# Patient Record
Sex: Female | Born: 1980 | Race: White | Hispanic: No | Marital: Married | Smoking: Current every day smoker
Health system: Southern US, Community
[De-identification: ages and names within clinical notes are randomized; demographics above are authoritative.]

## PROBLEM LIST (undated history)

## (undated) DIAGNOSIS — I10 Essential (primary) hypertension: Secondary | ICD-10-CM

## (undated) HISTORY — PX: TUBAL LIGATION: SHX77

## (undated) HISTORY — PX: RENAL ARTERY STENT: SHX2321

## (undated) HISTORY — PX: HERNIA REPAIR: SHX51

---

## 2011-12-31 ENCOUNTER — Emergency Department (HOSPITAL_BASED_OUTPATIENT_CLINIC_OR_DEPARTMENT_OTHER)
Admission: EM | Admit: 2011-12-31 | Discharge: 2011-12-31 | Disposition: A | Discharge status: Home / Self Care | Payer: Self-pay | Attending: Emergency Medicine | Admitting: Emergency Medicine

## 2011-12-31 ENCOUNTER — Emergency Department (INDEPENDENT_AMBULATORY_CARE_PROVIDER_SITE_OTHER): Payer: Self-pay

## 2011-12-31 ENCOUNTER — Encounter (HOSPITAL_BASED_OUTPATIENT_CLINIC_OR_DEPARTMENT_OTHER): Payer: Self-pay | Admitting: *Deleted

## 2011-12-31 DIAGNOSIS — R000 Tachycardia, unspecified: Secondary | ICD-10-CM

## 2011-12-31 DIAGNOSIS — Y92009 Unspecified place in unspecified non-institutional (private) residence as the place of occurrence of the external cause: Secondary | ICD-10-CM | POA: Insufficient documentation

## 2011-12-31 DIAGNOSIS — I10 Essential (primary) hypertension: Secondary | ICD-10-CM | POA: Insufficient documentation

## 2011-12-31 DIAGNOSIS — S8000XA Contusion of unspecified knee, initial encounter: Secondary | ICD-10-CM | POA: Insufficient documentation

## 2011-12-31 DIAGNOSIS — M25569 Pain in unspecified knee: Secondary | ICD-10-CM | POA: Insufficient documentation

## 2011-12-31 DIAGNOSIS — W010XXA Fall on same level from slipping, tripping and stumbling without subsequent striking against object, initial encounter: Secondary | ICD-10-CM | POA: Insufficient documentation

## 2011-12-31 DIAGNOSIS — F17200 Nicotine dependence, unspecified, uncomplicated: Secondary | ICD-10-CM | POA: Insufficient documentation

## 2011-12-31 DIAGNOSIS — S99919A Unspecified injury of unspecified ankle, initial encounter: Secondary | ICD-10-CM

## 2011-12-31 DIAGNOSIS — W172XXA Fall into hole, initial encounter: Secondary | ICD-10-CM

## 2011-12-31 DIAGNOSIS — S8001XA Contusion of right knee, initial encounter: Secondary | ICD-10-CM

## 2011-12-31 DIAGNOSIS — R05 Cough: Secondary | ICD-10-CM

## 2011-12-31 HISTORY — DX: Essential (primary) hypertension: I10

## 2011-12-31 MED ORDER — HYDROCODONE-ACETAMINOPHEN 5-325 MG PO TABS: 1.0000 | ORAL_TABLET | ORAL | Status: AC | PRN

## 2011-12-31 NOTE — ED Provider Notes (Signed)
History     CSN: [id number]  Arrival date & time 12/31/11  1611   First MD Initiated Contact with Patient 12/31/11 1748      Chief Complaint  Patient presents with  . Knee Pain    (Consider location/radiation/quality/duration/timing/severity/associated sxs/prior treatment) HPI History provided by pt.   Pt was playing with her children outside yesterday, stepped in a hole and fell forwards, landing on her knees and outstretched hands.  Did not hit her head.  C/o pain in her right knee only that is aggravated by bearing weight but especially by flexion.  Radiates 2/3 of the way down her shin.  No associated weakness/paresthesias.  Has not taken anything for pain.    Past Medical History  Diagnosis Date  . Hypertension     Past Surgical History  Procedure Date  . Tubal ligation   . Hernia repair     No family history on file.  History  Substance Use Topics  . Smoking status: Current Everyday Smoker  . Smokeless tobacco: Never Used  . Alcohol Use: No    OB History    Grav Para Term Preterm Abortions TAB SAB Ect Mult Living                  Review of Systems  All other systems reviewed and are negative.    Allergies  Motrin  Home Medications   Current Outpatient Rx  Name Route Sig Dispense Refill  . ACETAMINOPHEN 325 MG PO TABS Oral Take by mouth every 6 (six) hours as needed. Patient used this medication for her knee pain.    Marland Kitchen LISINOPRIL 10 MG PO TABS Oral Take 10 mg by mouth daily.    Marland Kitchen HYDROCODONE-ACETAMINOPHEN 5-325 MG PO TABS Oral Take 1 tablet by mouth every 4 (four) hours as needed for pain. 12 tablet 0    BP 172/108  Pulse 135  Temp(Src) 98.3 F (36.8 C) (Oral)  Resp 20  Ht 5\' 5"  (1.651 m)  Wt 270 lb (122.471 kg)  BMI 44.93 kg/m2  SpO2 99%  LMP 12/25/2011  Physical Exam  Nursing note and vitals reviewed. Constitutional: She is oriented to person, place, and time. She appears well-developed and well-nourished. No distress.  HENT:  Head:  Normocephalic and atraumatic.  Eyes:       Normal appearance  Neck: Normal range of motion.  Musculoskeletal:       Right knee w/out deformity or skin changes.  Mild edema compared to left.  Tenderness medial aspect of anterior knee as well as medial joint line and proximal tibia.  Pain w/ minimal passive flexion of knee.  No laxity w/ varus/valgus stress. Nml ankle exam. 2+ DP pulse and distal sensation intact.    Neurological: She is alert and oriented to person, place, and time.  Psychiatric: She has a normal mood and affect. Her behavior is normal.    ED Course  Procedures (including critical care time)   Date: 12/31/2011  Rate: 127  Rhythm: sinus tachycardia  QRS Axis: left  Intervals: normal  ST/T Wave abnormalities: normal  Conduction Disutrbances:none  Narrative Interpretation:   Old EKG Reviewed: none available   Labs Reviewed - No data to display Dg Knee Complete 4 Views Right  12/31/2011  *RADIOLOGY REPORT*  Clinical Data: Larey Seat in a hole 1 day ago injuring right knee, right anterior knee pain  RIGHT KNEE - COMPLETE 4+ VIEW  Comparison: None  Findings: Clothing artifacts distal thigh. Joint spaces preserved. No acute fracture,  dislocation or bone destruction. No knee joint effusion.  IMPRESSION: No acute bony abnormalities.  Original Report Authenticated By: Lollie Marrow, M.D.     1. Contusion of right knee       MDM  Healthy 31yo F presents w/ right knee injury secondary to mechanical fall yesterday.  Exam sig for mild edema, medial tenderness and pain w/ passive flexion.  Xray neg for acute fx/dislocation.  Results discussed w/ pt.  Nursing staff provided her w/ knee sleeve for comfort and d/c'd home w/ vicodin for pain.  Recommended ice, elevation and avoidance of activities that aggravate pain as well.  Referred to ortho for persistent pain or development of joint laxity.  6:26 PM   At time of discharge, HR is 138.  EKG shows sinus tach.  Pt is afebrile.  She has  had URI sx including cough for the past week but CXR neg for pneumonia.  She reports that she had an energy drink right before coming to ED and has had multiple mountain dews today.  Dr. Ethelda Chick has seen her and feels comfortable w/ her discharge.  I recommended that patient drink plenty of water and try to cut back on caffeine.  Also recommended that she f/u with her PCP within the next week for BP recheck.  Recently had lisinopril dose cut from 20mg  to 10mg /d.         Otilio Miu, PA 12/31/11 1910  Arie Sabina [place], [place] 12/31/11 Ernestina [place]

## 2011-12-31 NOTE — ED Notes (Signed)
Pt reports she fell in a hole- fell yesterday- c/o pain in right knee- ambulatory

## 2011-12-31 NOTE — ED Provider Notes (Signed)
Complains of right knee pain since falling the whole yesterday also admits to mild cough.. denies shortness of breath denies chest pain denies lightheadedness. Patient noted to be tachycardic. She reports taking "an energy pill", "an energy drink"  And drinking several mountain dews today in order to stay awake.  Tachycardia felt to be secondary to caffeine and stimulants; she is advised to stop drinking energy drinks, nergy pills andMountain Dew Sinus tachycardia felt secondary to stimulants  Doug Sou, MD 12/31/11 1919

## 2011-12-31 NOTE — ED Notes (Signed)
Pt HR 138 and BP elevated. PA notified and further tests initiated.

## 2011-12-31 NOTE — ED Notes (Signed)
Pt acuity changed from 4 to 3 based on pt vitals and plan of care.

## 2011-12-31 NOTE — ED Notes (Signed)
Pt stated she fell in a hole in her yard yesterday afternoon and twisted her right knee. Pt treated pain with tylenol and ice. Pt denies hitting her head.

## 2011-12-31 NOTE — Discharge Instructions (Signed)
Take tylenol as needed for mild-moderate pain.  Take vicodin as prescribed for severe pain.   Do not drive within four hours of taking this medication (may cause drowsiness or confusion).   Ice 2-3 times a day for 15-20 minutes, elevate when possible and avoid activities that aggravate pain.   Follow up with the orthopedic doctor if pain has not started to improve in 5-7 days or you develop weakness of joint.

## 2012-01-01 NOTE — ED Provider Notes (Signed)
Medical screening examination/treatment/procedure(s) were conducted as a shared visit with non-physician practitioner(s) and myself.  I personally evaluated the patient during the encounter  Doug Sou, MD 01/01/12 [phone]

## 2012-01-29 ENCOUNTER — Emergency Department (INDEPENDENT_AMBULATORY_CARE_PROVIDER_SITE_OTHER): Payer: Self-pay

## 2012-01-29 ENCOUNTER — Emergency Department (HOSPITAL_BASED_OUTPATIENT_CLINIC_OR_DEPARTMENT_OTHER)
Admission: EM | Admit: 2012-01-29 | Discharge: 2012-01-29 | Disposition: A | Discharge status: Home / Self Care | Payer: Self-pay | Attending: Emergency Medicine | Admitting: Emergency Medicine

## 2012-01-29 ENCOUNTER — Encounter (HOSPITAL_BASED_OUTPATIENT_CLINIC_OR_DEPARTMENT_OTHER): Payer: Self-pay

## 2012-01-29 DIAGNOSIS — IMO0002 Reserved for concepts with insufficient information to code with codable children: Secondary | ICD-10-CM | POA: Insufficient documentation

## 2012-01-29 DIAGNOSIS — X503XXA Overexertion from repetitive movements, initial encounter: Secondary | ICD-10-CM | POA: Insufficient documentation

## 2012-01-29 DIAGNOSIS — S46919A Strain of unspecified muscle, fascia and tendon at shoulder and upper arm level, unspecified arm, initial encounter: Secondary | ICD-10-CM

## 2012-01-29 DIAGNOSIS — I10 Essential (primary) hypertension: Secondary | ICD-10-CM | POA: Insufficient documentation

## 2012-01-29 DIAGNOSIS — M25519 Pain in unspecified shoulder: Secondary | ICD-10-CM

## 2012-01-29 DIAGNOSIS — X500XXA Overexertion from strenuous movement or load, initial encounter: Secondary | ICD-10-CM

## 2012-01-29 MED ORDER — TRAMADOL HCL 50 MG PO TABS: 50.0000 mg | ORAL_TABLET | Freq: Four times a day (QID) | ORAL | Status: DC | PRN

## 2012-01-29 MED ORDER — TRAMADOL HCL 50 MG PO TABS: 50.0000 mg | ORAL_TABLET | Freq: Four times a day (QID) | ORAL | Status: AC | PRN

## 2012-01-29 NOTE — ED Notes (Signed)
Pt advised of need to keep record of BP and HR-states her PCP has been adjusting her BP meds and she has appt in approx 1-2 weeks for f/u

## 2012-01-29 NOTE — ED Provider Notes (Signed)
History     CSN: [id number]  Arrival date & time 01/29/12  1956   First MD Initiated Contact with Patient 01/29/12 2109      Chief Complaint  Patient presents with  . Shoulder Injury    (Consider location/radiation/quality/duration/timing/severity/associated sxs/prior treatment) HPI Comments: Pt states that she was moving furniture today and now has left shoulder pain with movement  Patient is a 31 y.o. female presenting with shoulder injury. The history is provided by the patient. No language interpreter was used.  Shoulder Injury This is a new problem. The current episode started today. The problem occurs constantly. The problem has been unchanged. The symptoms are aggravated by bending. She has tried acetaminophen for the symptoms.    Past Medical History  Diagnosis Date  . Hypertension     Past Surgical History  Procedure Date  . Tubal ligation   . Hernia repair     History reviewed. No pertinent family history.  History  Substance Use Topics  . Smoking status: Current Everyday Smoker  . Smokeless tobacco: Never Used  . Alcohol Use: No    OB History    Grav Para Term Preterm Abortions TAB SAB Ect Mult Living                  Review of Systems  Constitutional: Negative.   Respiratory: Negative.   Cardiovascular: Negative.   Neurological: Negative.     Allergies  Motrin  Home Medications   Current Outpatient Rx  Name Route Sig Dispense Refill  . ACETAMINOPHEN 325 MG PO TABS Oral Take by mouth every 6 (six) hours as needed. Patient used this medication for her knee pain.    Marland Kitchen LISINOPRIL 10 MG PO TABS Oral Take 10 mg by mouth daily.    . TRAMADOL HCL 50 MG PO TABS Oral Take 1 tablet (50 mg total) by mouth every 6 (six) hours as needed for pain. 15 tablet 0    BP 179/109  Pulse 135  Temp(Src) 98.5 F (36.9 C) (Oral)  Resp 20  Ht 5\' 5"  (1.651 m)  Wt 270 lb (122.471 kg)  BMI 44.93 kg/m2  SpO2 99%  LMP 01/25/2012  Physical Exam  Nursing note  and vitals reviewed. Constitutional: She is oriented to person, place, and time. She appears well-developed and well-nourished.  Cardiovascular: Normal rate and regular rhythm.   Pulmonary/Chest: Effort normal and breath sounds normal.  Musculoskeletal:       Pt has generalized tenderness to the left shoulder:pt has full rom:no deformity noted:pulses intact:neurologically intact  Neurological: She is alert and oriented to person, place, and time.  Skin: Skin is dry.  Psychiatric: She has a normal mood and affect.    ED Course  Procedures (including critical care time)  Labs Reviewed - No data to display Dg Shoulder Left  01/29/2012  *RADIOLOGY REPORT*  Clinical Data: Pain after lifting injury.  LEFT SHOULDER - 2+ VIEW  Comparison: Chest film 12/31/2011  Findings: No acute fracture or dislocation.  Visualized portion of the left hemithorax is normal.  IMPRESSION: Normal left shoulder.  Original Report Authenticated By: Consuello Bossier, M.D.     1. Shoulder strain       MDM  No acute bony abnormality noted:will treat with tramadol for pain:pt educated on htn        Teressa Lower, NP 01/29/12 2128  Teressa Lower, NP 01/29/12 2130

## 2012-01-29 NOTE — Discharge Instructions (Signed)
Joint Sprain A sprain is a tear or stretch in the ligaments that hold a joint together. Severe sprains may need as long as 3-6 weeks of immobilization and/or exercises to heal completely. Sprained joints should be rested and protected. If not, they can become unstable and prone to re-injury. Proper treatment can reduce your pain, shorten the period of disability, and reduce the risk of repeated injuries. TREATMENT   Rest and elevate the injured joint to reduce pain and swelling.   Apply ice packs to the injury for 20-30 minutes every 2-3 hours for the next 2-3 days.   Keep the injury wrapped in a compression bandage or splint as long as the joint is painful or as instructed by your caregiver.   Do not use the injured joint until it is completely healed to prevent re-injury and chronic instability. Follow the instructions of your caregiver.   Long-term sprain management may require exercises and/or treatment by a physical therapist. Taping or special braces may help stabilize the joint until it is completely better.  SEEK MEDICAL CARE IF:   You develop increased pain or swelling of the joint.   You develop increasing redness and warmth of the joint.   You develop a fever.   It becomes stiff.   Your hand or foot gets cold or numb.  Document Released: 11/05/2004 Document Revised: 09/17/2011 Document Reviewed: 10/15/2008 [employer] Patient Information 2012 [employer]. 

## 2012-01-29 NOTE — ED Notes (Addendum)
Pt states that she has injured her leftshoulder today while moving.  Pt states that she has taken tylenol with no relief.  No obvious deformity.

## 2012-01-30 NOTE — ED Provider Notes (Signed)
Medical screening examination/treatment/procedure(s) were performed by non-physician practitioner and as supervising physician I was immediately available for consultation/collaboration.   Carleene Cooper III, MD 01/30/12 [phone]

## 2013-01-14 ENCOUNTER — Emergency Department (HOSPITAL_BASED_OUTPATIENT_CLINIC_OR_DEPARTMENT_OTHER): Payer: 59

## 2013-01-14 ENCOUNTER — Encounter (HOSPITAL_BASED_OUTPATIENT_CLINIC_OR_DEPARTMENT_OTHER): Payer: Self-pay

## 2013-01-14 ENCOUNTER — Emergency Department (HOSPITAL_BASED_OUTPATIENT_CLINIC_OR_DEPARTMENT_OTHER)
Admission: EM | Admit: 2013-01-14 | Discharge: 2013-01-14 | Disposition: A | Discharge status: Home / Self Care | Payer: 59 | Attending: Emergency Medicine | Admitting: Emergency Medicine

## 2013-01-14 DIAGNOSIS — IMO0002 Reserved for concepts with insufficient information to code with codable children: Secondary | ICD-10-CM | POA: Insufficient documentation

## 2013-01-14 DIAGNOSIS — W010XXA Fall on same level from slipping, tripping and stumbling without subsequent striking against object, initial encounter: Secondary | ICD-10-CM | POA: Insufficient documentation

## 2013-01-14 DIAGNOSIS — S8391XA Sprain of unspecified site of right knee, initial encounter: Secondary | ICD-10-CM

## 2013-01-14 DIAGNOSIS — Y92009 Unspecified place in unspecified non-institutional (private) residence as the place of occurrence of the external cause: Secondary | ICD-10-CM | POA: Insufficient documentation

## 2013-01-14 DIAGNOSIS — Z79899 Other long term (current) drug therapy: Secondary | ICD-10-CM | POA: Insufficient documentation

## 2013-01-14 DIAGNOSIS — Y9383 Activity, rough housing and horseplay: Secondary | ICD-10-CM | POA: Insufficient documentation

## 2013-01-14 DIAGNOSIS — F17200 Nicotine dependence, unspecified, uncomplicated: Secondary | ICD-10-CM | POA: Insufficient documentation

## 2013-01-14 DIAGNOSIS — I10 Essential (primary) hypertension: Secondary | ICD-10-CM | POA: Insufficient documentation

## 2013-01-14 MED ORDER — OXYCODONE-ACETAMINOPHEN 5-325 MG PO TABS: 1.0000 | ORAL_TABLET | Freq: Four times a day (QID) | ORAL | Status: DC | PRN

## 2013-01-14 MED ORDER — OXYCODONE-ACETAMINOPHEN 5-325 MG PO TABS
2.0000 | ORAL_TABLET | Freq: Once | ORAL | Status: AC
  Administered 2013-01-14: 2 via ORAL
  Filled 2013-01-14 (×2): qty 2

## 2013-01-14 NOTE — ED Notes (Signed)
MD at bedside. 

## 2013-01-14 NOTE — ED Notes (Signed)
Pt states that she was playing outside with her children and fell into a hole injuring her R knee.  Pt states that with flexion of the knee she feels a pulling sensation like a rubber band.

## 2013-01-14 NOTE — ED Provider Notes (Signed)
History    This chart was scribed for Charles B. Bernette Mayers, MD scribed by Magnus Sinning. The patient was seen in room MH10/MH10 at 15:52  CSN: [id number]  Arrival date & time 01/14/13  1427   First MD Initiated Contact with Patient 01/14/13 1540   Chief Complaint  Patient presents with  . Knee Injury    (Consider location/radiation/quality/duration/timing/severity/associated sxs/prior treatment) The history is provided by the patient. No language interpreter was used.   Samantha Griffin is a 32 y.o. female who presents to the Emergency Department complaining of one day of gradually worsened moderate right knee pain with associated right calf pain, as a result of an injury after falling into a hole in her yard when playing with children yesterday.The patient states pain is aggravated with bending and notes she has been unable to get comfortable since. Past Medical History  Diagnosis Date  . Hypertension     Past Surgical History  Procedure Laterality Date  . Tubal ligation    . Hernia repair    . Renal artery stent      History reviewed. No pertinent family history.  History  Substance Use Topics  . Smoking status: Current Every Day Smoker    Types: Cigarettes  . Smokeless tobacco: Never Used  . Alcohol Use: No    Review of Systems  Musculoskeletal:       Right knee pain    Allergies  Motrin and Ultram  Home Medications   Current Outpatient Rx  Name  Route  Sig  Dispense  Refill  . acetaminophen (TYLENOL) 325 MG tablet   Oral   Take by mouth every 6 (six) hours as needed. Patient used this medication for her knee pain.         Marland Kitchen lisinopril (PRINIVIL,ZESTRIL) 10 MG tablet   Oral   Take 10 mg by mouth daily.         . naproxen sodium (ANAPROX) 220 MG tablet   Oral   Take 220 mg by mouth 2 (two) times daily with a meal.           BP 161/97  Pulse 128  Temp(Src) 98.1 F (36.7 C) (Oral)  Resp 20  Ht 5\' 6"  (1.676 m)  Wt 275 lb (124.739 kg)  BMI 44.41  kg/m2  SpO2 95%  LMP 12/30/2012  Physical Exam  Nursing note and vitals reviewed. Constitutional: She is oriented to person, place, and time. She appears well-developed and well-nourished.  HENT:  Head: Normocephalic and atraumatic.  Eyes: Conjunctivae are normal. Right eye exhibits no discharge. Left eye exhibits no discharge.  Neck: Neck supple.  Pulmonary/Chest: Effort normal.  Musculoskeletal:  Tenderness to the right knee with mild instability with anterior drawer. Neurovascularly intact.  Neurological: She is alert and oriented to person, place, and time. No cranial nerve deficit.  Skin: Skin is warm and dry. No rash noted.  Psychiatric: She has a normal mood and affect. Her behavior is normal.    ED Course  Procedures (including critical care time) DIAGNOSTIC STUDIES: Oxygen Saturation is 95% on room air, adequate by my interpretation.    COORDINATION OF CARE: 15:53: Physical exam performed. Labs Reviewed - No data to display Dg Knee Complete 4 Views Right  01/14/2013  *RADIOLOGY REPORT*  Clinical Data: Injured knee.  Anterior pain.  RIGHT KNEE - COMPLETE 4+ VIEW  Comparison: 12/31/2011.  Findings: No fracture or dislocation.  IMPRESSION: No fracture.   Original Report Authenticated By: Lacy Duverney, M.D.  1. Knee sprain and strain, right, initial encounter       MDM  Xray neg for bony injury. Suspect ligamentous injury, will place in knee immobilizer, pain meds, Ortho follow up.   I personally performed the services described in this documentation, which was scribed in my presence. The recorded information has been reviewed and is accurate.          Charles B. Bernette Mayers, MD 01/14/13 1606

## 2013-07-13 ENCOUNTER — Encounter (HOSPITAL_BASED_OUTPATIENT_CLINIC_OR_DEPARTMENT_OTHER): Payer: Self-pay | Admitting: *Deleted

## 2013-07-13 ENCOUNTER — Emergency Department (HOSPITAL_BASED_OUTPATIENT_CLINIC_OR_DEPARTMENT_OTHER)
Admission: EM | Admit: 2013-07-13 | Discharge: 2013-07-13 | Disposition: A | Discharge status: Home / Self Care | Payer: 59 | Attending: Emergency Medicine | Admitting: Emergency Medicine

## 2013-07-13 ENCOUNTER — Emergency Department (HOSPITAL_BASED_OUTPATIENT_CLINIC_OR_DEPARTMENT_OTHER): Payer: 59

## 2013-07-13 DIAGNOSIS — Y9389 Activity, other specified: Secondary | ICD-10-CM | POA: Insufficient documentation

## 2013-07-13 DIAGNOSIS — W108XXA Fall (on) (from) other stairs and steps, initial encounter: Secondary | ICD-10-CM | POA: Insufficient documentation

## 2013-07-13 DIAGNOSIS — F17200 Nicotine dependence, unspecified, uncomplicated: Secondary | ICD-10-CM | POA: Insufficient documentation

## 2013-07-13 DIAGNOSIS — S96911A Strain of unspecified muscle and tendon at ankle and foot level, right foot, initial encounter: Secondary | ICD-10-CM

## 2013-07-13 DIAGNOSIS — Y929 Unspecified place or not applicable: Secondary | ICD-10-CM | POA: Insufficient documentation

## 2013-07-13 DIAGNOSIS — I10 Essential (primary) hypertension: Secondary | ICD-10-CM | POA: Insufficient documentation

## 2013-07-13 DIAGNOSIS — S93609A Unspecified sprain of unspecified foot, initial encounter: Secondary | ICD-10-CM | POA: Insufficient documentation

## 2013-07-13 DIAGNOSIS — Z791 Long term (current) use of non-steroidal anti-inflammatories (NSAID): Secondary | ICD-10-CM | POA: Insufficient documentation

## 2013-07-13 MED ORDER — HYDROCODONE-ACETAMINOPHEN 5-325 MG PO TABS: 2.0000 | ORAL_TABLET | ORAL | Status: DC | PRN

## 2013-07-13 NOTE — ED Notes (Signed)
Pt c/o right ankle injury fall down stairs x 8 hrs ago

## 2013-07-13 NOTE — ED Provider Notes (Signed)
CSN: [id number]     Arrival date & time 07/13/13  1934 History   First MD Initiated Contact with Patient 07/13/13 1944     Chief Complaint  Patient presents with  . Ankle Pain   (Consider location/radiation/quality/duration/timing/severity/associated sxs/prior Treatment) HPI Comments: Pt states that she fell on her stairs 8 hours ago and his now having pain and swelling to there right foot:denies loc:pt states that she is able to walk on the foot but she is in a lot of pain:denies numbness  The history is provided by the patient. No language interpreter was used.    Past Medical History  Diagnosis Date  . Hypertension    Past Surgical History  Procedure Laterality Date  . Tubal ligation    . Hernia repair    . Renal artery stent     History reviewed. No pertinent family history. History  Substance Use Topics  . Smoking status: Current Every Day Smoker    Types: Cigarettes  . Smokeless tobacco: Never Used  . Alcohol Use: No   OB History   Grav Para Term Preterm Abortions TAB SAB Ect Mult Living                 Review of Systems  Constitutional: Negative.   Respiratory: Negative.   Cardiovascular: Negative.     Allergies  Motrin and Ultram  Home Medications   Current Outpatient Rx  Name  Route  Sig  Dispense  Refill  . acetaminophen (TYLENOL) 325 MG tablet   Oral   Take by mouth every 6 (six) hours as needed. Patient used this medication for her knee pain.         . naproxen sodium (ANAPROX) 220 MG tablet   Oral   Take 220 mg by mouth 2 (two) times daily with a meal.         . oxyCODONE-acetaminophen (PERCOCET/ROXICET) 5-325 MG per tablet   Oral   Take 1-2 tablets by mouth every 6 (six) hours as needed for pain.   20 tablet   0    BP 147/98  Pulse 103  Temp(Src) 99.7 F (37.6 C) (Oral)  Resp 16  Ht 5\' 5"  (1.651 m)  Wt 260 lb (117.935 kg)  BMI 43.27 kg/m2  SpO2 98%  LMP 07/04/2013 Physical Exam  Nursing note and vitals  reviewed. Constitutional: She is oriented to person, place, and time. She appears well-developed and well-nourished.  Cardiovascular: Normal rate and regular rhythm.   Pulmonary/Chest: Effort normal and breath sounds normal.  Musculoskeletal: Normal range of motion.  Swelling to the top of the right foot in the lateral aspect:pt has full HYQ:MVHQIO intact  Neurological: She is alert and oriented to person, place, and time.  Skin: Skin is warm and dry.    ED Course  Procedures (including critical care time) Labs Review Labs Reviewed - No data to display Imaging Review Dg Ankle Complete Right  07/13/2013   CLINICAL DATA:  Fall.  Ankle injury. Lateral ankle pain.  EXAM: RIGHT ANKLE - COMPLETE 3+ VIEW  COMPARISON:  None.  FINDINGS: There is no evidence of fracture, dislocation, or joint effusion. There is no evidence of arthropathy or other focal bone abnormality. Soft tissues are unremarkable.  IMPRESSION: Negative.   Electronically Signed   By: Myles Rosenthal M.D.   On: 07/13/2013 20:26   Dg Foot Complete Right  07/13/2013   CLINICAL DATA:  Fall. Foot injury and pain.  EXAM: RIGHT FOOT COMPLETE - 3+ VIEW  COMPARISON:  None.  FINDINGS: There is no evidence of fracture or dislocation. There is no evidence of arthropathy or other focal bone abnormality. Soft tissues are unremarkable. Small plantar calcaneal spur incidentally noted.  IMPRESSION: No acute findings.   Electronically Signed   By: Myles Rosenthal M.D.   On: 07/13/2013 20:27    MDM   1. Right foot strain, initial encounter    Pt wrapped for comfort:no acute bony abnormality noted:pt wrapped for comfort:given vicodin and follow up with DR. Vivi Barrack, NP 07/13/13 2045

## 2013-07-13 NOTE — ED Provider Notes (Signed)
  Medical screening examination/treatment/procedure(s) were performed by non-physician practitioner and as supervising physician I was immediately available for consultation/collaboration.   Gerhard Munch, MD 07/13/13 2318

## 2013-12-03 ENCOUNTER — Encounter (HOSPITAL_BASED_OUTPATIENT_CLINIC_OR_DEPARTMENT_OTHER): Payer: Self-pay | Admitting: Emergency Medicine

## 2013-12-03 ENCOUNTER — Emergency Department (HOSPITAL_BASED_OUTPATIENT_CLINIC_OR_DEPARTMENT_OTHER)
Admission: EM | Admit: 2013-12-03 | Discharge: 2013-12-03 | Disposition: A | Discharge status: Home / Self Care | Payer: Medicaid Other | Attending: Emergency Medicine | Admitting: Emergency Medicine

## 2013-12-03 DIAGNOSIS — E669 Obesity, unspecified: Secondary | ICD-10-CM | POA: Insufficient documentation

## 2013-12-03 DIAGNOSIS — Y929 Unspecified place or not applicable: Secondary | ICD-10-CM | POA: Insufficient documentation

## 2013-12-03 DIAGNOSIS — X500XXA Overexertion from strenuous movement or load, initial encounter: Secondary | ICD-10-CM | POA: Insufficient documentation

## 2013-12-03 DIAGNOSIS — S39012A Strain of muscle, fascia and tendon of lower back, initial encounter: Secondary | ICD-10-CM

## 2013-12-03 DIAGNOSIS — F17200 Nicotine dependence, unspecified, uncomplicated: Secondary | ICD-10-CM | POA: Insufficient documentation

## 2013-12-03 DIAGNOSIS — S339XXA Sprain of unspecified parts of lumbar spine and pelvis, initial encounter: Secondary | ICD-10-CM | POA: Insufficient documentation

## 2013-12-03 DIAGNOSIS — I10 Essential (primary) hypertension: Secondary | ICD-10-CM | POA: Insufficient documentation

## 2013-12-03 DIAGNOSIS — Z79899 Other long term (current) drug therapy: Secondary | ICD-10-CM | POA: Insufficient documentation

## 2013-12-03 DIAGNOSIS — Y9389 Activity, other specified: Secondary | ICD-10-CM | POA: Insufficient documentation

## 2013-12-03 DIAGNOSIS — S335XXA Sprain of ligaments of lumbar spine, initial encounter: Principal | ICD-10-CM

## 2013-12-03 MED ORDER — NAPROXEN 500 MG PO TABS: 500.0000 mg | ORAL_TABLET | Freq: Two times a day (BID) | ORAL | Status: DC

## 2013-12-03 MED ORDER — HYDROCODONE-ACETAMINOPHEN 5-325 MG PO TABS
1.0000 | ORAL_TABLET | Freq: Once | ORAL | Status: AC
  Administered 2013-12-03: 1 via ORAL
  Filled 2013-12-03: qty 1

## 2013-12-03 MED ORDER — HYDROCODONE-ACETAMINOPHEN 5-325 MG PO TABS: 1.0000 | ORAL_TABLET | Freq: Four times a day (QID) | ORAL | Status: DC | PRN

## 2013-12-03 MED ORDER — KETOROLAC TROMETHAMINE 30 MG/ML IJ SOLN: 30.0000 mg | Freq: Once | INTRAMUSCULAR | Status: DC

## 2013-12-03 MED ORDER — CYCLOBENZAPRINE HCL 10 MG PO TABS
5.0000 mg | ORAL_TABLET | Freq: Once | ORAL | Status: AC
  Administered 2013-12-03: 5 mg via ORAL
  Filled 2013-12-03: qty 1

## 2013-12-03 MED ORDER — NAPROXEN 250 MG PO TABS
500.0000 mg | ORAL_TABLET | Freq: Two times a day (BID) | ORAL | Status: DC
  Administered 2013-12-03: 500 mg via ORAL
  Filled 2013-12-03: qty 2

## 2013-12-03 NOTE — ED Notes (Signed)
Patient here with lower right sided back pain x 2 days. Pain started while moving sofa on Friday and twisted when it slipped out of her hand, no relief with otc meds

## 2013-12-03 NOTE — ED Provider Notes (Signed)
CSN: [id number]     Arrival date & time 12/03/13  1254 History   First MD Initiated Contact with Patient 12/03/13 1305     Chief Complaint  Patient presents with  . Back Pain     (Consider location/radiation/quality/duration/timing/severity/associated sxs/prior Treatment) HPI  This a 33 year old female who presents with back pain. Patient reports that she was lifting a sofa on Friday when she twisted her back and felt spasming of the right side of her back. She reports 4/10 pain on the right side of her back. She states "it feels like a knot." Patient has tried Tylenol home without relief. She's allergic to ibuprofen. Patient denies any urinary symptoms, weakness, numbness, tingling, history of drug abuse, recent steroid use. She has been ambulatory.  Past Medical History  Diagnosis Date  . Hypertension    Past Surgical History  Procedure Laterality Date  . Tubal ligation    . Hernia repair    . Renal artery stent     No family history on file. History  Substance Use Topics  . Smoking status: Current Every Day Smoker    Types: Cigarettes  . Smokeless tobacco: Never Used  . Alcohol Use: No   OB History   Grav Para Term Preterm Abortions TAB SAB Ect Mult Living                 Review of Systems  Constitutional: Negative for fever.  Genitourinary: Negative for dysuria.       Denies urinary retention  Musculoskeletal: Positive for back pain.  Skin: Negative for wound.  Neurological: Negative for weakness and numbness.  All other systems reviewed and are negative.      Allergies  Motrin and Ultram  Home Medications   Current Outpatient Rx  Name  Route  Sig  Dispense  Refill  . lisinopril (PRINIVIL,ZESTRIL) 10 MG tablet   Oral   Take 10 mg by mouth daily.         Marland Kitchen. HYDROcodone-acetaminophen (NORCO/VICODIN) 5-325 MG per tablet   Oral   Take 1 tablet by mouth every 6 (six) hours as needed.   6 tablet   0   . naproxen (NAPROSYN) 500 MG tablet   Oral  Take 1 tablet (500 mg total) by mouth 2 (two) times daily.   30 tablet   0    BP 188/124  Pulse 114  Temp(Src) 97.4 F (36.3 C) (Oral)  Resp 20  Ht 5\' 7"  (1.702 m)  Wt 260 lb (117.935 kg)  BMI 40.71 kg/m2  SpO2 99% Physical Exam  Nursing note and vitals reviewed. Constitutional: She is oriented to person, place, and time. No distress.  obese  HENT:  Head: Normocephalic and atraumatic.  Cardiovascular: Normal rate and regular rhythm.   Pulmonary/Chest: Effort normal and breath sounds normal. No respiratory distress.  Abdominal: Soft. There is no tenderness.  Musculoskeletal:  Tenderness to palpation and spasm noted over the right lumbar paraspinous muscle region, no midline tenderness step-off or deformity,   Neurological: She is alert and oriented to person, place, and time.  Skin: Skin is warm and dry.  Psychiatric: She has a normal mood and affect.    ED Course  Procedures (including critical care time) Labs Review Labs Reviewed - No data to display Imaging Review No results found.  EKG Interpretation   None       MDM   Final diagnoses:  Lumbosacral strain    Patient presents with back pain following an injury on Friday.  She is nontoxic-appearing. She has no red flag for back pain. She has paraspinous muscle tenderness and spasm. Patient was given naproxen, Norco, and Flexeril the knee. At this time have no indication for plain films and low suspicion for fracture. Patient will be discharged home with a short course of pain medication and will be encouraged to continue naproxen and back exercises.  After history, exam, and medical workup I feel the patient has been appropriately medically screened and is safe for discharge home. Pertinent diagnoses were discussed with the patient. Patient was given return precautions.     Shon Baton, MD 12/03/13 [phone]

## 2013-12-03 NOTE — ED Notes (Signed)
MD at bedside. 

## 2013-12-03 NOTE — Discharge Instructions (Signed)
Back Pain, Adult Low back pain is very common. About 1 in 5 people have back pain.The cause of low back pain is rarely dangerous. The pain often gets better over time.About half of people with a sudden onset of back pain feel better in just 2 weeks. About 8 in 10 people feel better by 6 weeks.  CAUSES Some common causes of back pain include:  Strain of the muscles or ligaments supporting the spine.  Wear and tear (degeneration) of the spinal discs.  Arthritis.  Direct injury to the back. DIAGNOSIS Most of the time, the direct cause of low back pain is not known.However, back pain can be treated effectively even when the exact cause of the pain is unknown.Answering your caregiver's questions about your overall health and symptoms is one of the most accurate ways to make sure the cause of your pain is not dangerous. If your caregiver needs more information, he or she may order lab work or imaging tests (X-rays or MRIs).However, even if imaging tests show changes in your back, this usually does not require surgery. HOME CARE INSTRUCTIONS For many people, back pain returns.Since low back pain is rarely dangerous, it is often a condition that people can learn to manageon their own.   Remain active. It is stressful on the back to sit or stand in one place. Do not sit, drive, or stand in one place for more than 30 minutes at a time. Take short walks on level surfaces as soon as pain allows.Try to increase the length of time you walk each day.  Do not stay in bed.Resting more than 1 or 2 days can delay your recovery.  Do not avoid exercise or work.Your body is made to move.It is not dangerous to be active, even though your back may hurt.Your back will likely heal faster if you return to being active before your pain is gone.  Pay attention to your body when you bend and lift. Many people have less discomfortwhen lifting if they bend their knees, keep the load close to their bodies,and  avoid twisting. Often, the most comfortable positions are those that put less stress on your recovering back.  Find a comfortable position to sleep. Use a firm mattress and lie on your side with your knees slightly bent. If you lie on your back, put a pillow under your knees.  Only take over-the-counter or prescription medicines as directed by your caregiver. Over-the-counter medicines to reduce pain and inflammation are often the most helpful.Your caregiver may prescribe muscle relaxant drugs.These medicines help dull your pain so you can more quickly return to your normal activities and healthy exercise.  Put ice on the injured area.  Put ice in a plastic bag.  Place a towel between your skin and the bag.  Leave the ice on for 15-20 minutes, 03-04 times a day for the first 2 to 3 days. After that, ice and heat may be alternated to reduce pain and spasms.  Ask your caregiver about trying back exercises and gentle massage. This may be of some benefit.  Avoid feeling anxious or stressed.Stress increases muscle tension and can worsen back pain.It is important to recognize when you are anxious or stressed and learn ways to manage it.Exercise is a great option. SEEK MEDICAL CARE IF:  You have pain that is not relieved with rest or medicine.  You have pain that does not improve in 1 week.  You have new symptoms.  You are generally not feeling well. SEEK   IMMEDIATE MEDICAL CARE IF:   You have pain that radiates from your back into your legs.  You develop new bowel or bladder control problems.  You have unusual weakness or numbness in your arms or legs.  You develop nausea or vomiting.  You develop abdominal pain.  You feel faint. Document Released: 09/28/2005 Document Revised: 03/29/2012 Document Reviewed: 02/16/2011 [employer] Patient Information 2014 [employer].  

## 2014-01-21 ENCOUNTER — Emergency Department (HOSPITAL_BASED_OUTPATIENT_CLINIC_OR_DEPARTMENT_OTHER): Payer: Medicaid Other

## 2014-01-21 ENCOUNTER — Encounter (HOSPITAL_BASED_OUTPATIENT_CLINIC_OR_DEPARTMENT_OTHER): Payer: Self-pay | Admitting: Emergency Medicine

## 2014-01-21 ENCOUNTER — Emergency Department (HOSPITAL_BASED_OUTPATIENT_CLINIC_OR_DEPARTMENT_OTHER)
Admission: EM | Admit: 2014-01-21 | Discharge: 2014-01-21 | Disposition: A | Discharge status: Home / Self Care | Payer: Medicaid Other | Attending: Emergency Medicine | Admitting: Emergency Medicine

## 2014-01-21 DIAGNOSIS — Y929 Unspecified place or not applicable: Secondary | ICD-10-CM | POA: Insufficient documentation

## 2014-01-21 DIAGNOSIS — Y9389 Activity, other specified: Secondary | ICD-10-CM | POA: Insufficient documentation

## 2014-01-21 DIAGNOSIS — I10 Essential (primary) hypertension: Secondary | ICD-10-CM | POA: Insufficient documentation

## 2014-01-21 DIAGNOSIS — F17200 Nicotine dependence, unspecified, uncomplicated: Secondary | ICD-10-CM | POA: Insufficient documentation

## 2014-01-21 DIAGNOSIS — S9000XA Contusion of unspecified ankle, initial encounter: Secondary | ICD-10-CM | POA: Insufficient documentation

## 2014-01-21 DIAGNOSIS — X500XXA Overexertion from strenuous movement or load, initial encounter: Secondary | ICD-10-CM | POA: Insufficient documentation

## 2014-01-21 DIAGNOSIS — S93409A Sprain of unspecified ligament of unspecified ankle, initial encounter: Secondary | ICD-10-CM | POA: Insufficient documentation

## 2014-01-21 DIAGNOSIS — R296 Repeated falls: Secondary | ICD-10-CM | POA: Insufficient documentation

## 2014-01-21 DIAGNOSIS — Z79899 Other long term (current) drug therapy: Secondary | ICD-10-CM | POA: Insufficient documentation

## 2014-01-21 MED ORDER — OXYCODONE-ACETAMINOPHEN 5-325 MG PO TABS
1.0000 | ORAL_TABLET | Freq: Once | ORAL | Status: AC
  Administered 2014-01-21: 1 via ORAL
  Filled 2014-01-21: qty 1

## 2014-01-21 NOTE — Discharge Instructions (Signed)
Return to the ED with any concerns including increased pain, swelling/discoloration/numbness of foot or toes, or any other alarming symptoms °

## 2014-01-21 NOTE — ED Provider Notes (Signed)
CSN: [id number]     Arrival date & time 01/21/14  1814 History  This chart was scribed for Samantha Chick, MD by Beverly Milch, ED Scribe. This patient was seen in room MH06/MH06 and the patient's care was started at 6:57 PM.    Chief Complaint  Patient presents with  . Ankle Injury      Patient is a 33 y.o. female presenting with lower extremity injury. The history is provided by the patient. No language interpreter was used.  Ankle Injury This is a new problem. The current episode started 3 to 5 hours ago. The problem occurs constantly. The problem has been gradually worsening. Pertinent negatives include no chest pain, no abdominal pain, no headaches and no shortness of breath. The symptoms are aggravated by walking, exertion, stress and twisting. The symptoms are relieved by ice and acetaminophen. She has tried acetaminophen for the symptoms. The treatment provided mild relief.  Pt reports she was playing with her children and fell twisting her right ankle.   Past Medical History  Diagnosis Date  . Hypertension    Past Surgical History  Procedure Laterality Date  . Tubal ligation    . Hernia repair    . Renal artery stent     No family history on file. History  Substance Use Topics  . Smoking status: Current Every Day Smoker    Types: Cigarettes  . Smokeless tobacco: Never Used  . Alcohol Use: No   OB History   Grav Para Term Preterm Abortions TAB SAB Ect Mult Living                 Review of Systems  Respiratory: Negative for shortness of breath.   Cardiovascular: Negative for chest pain.  Gastrointestinal: Negative for abdominal pain.  Musculoskeletal: Positive for arthralgias (right ankle).  Neurological: Negative for headaches.  All other systems reviewed and are negative.     Allergies  Motrin and Ultram  Home Medications   Current Outpatient Rx  Name  Route  Sig  Dispense  Refill  . lisinopril-hydrochlorothiazide (PRINZIDE,ZESTORETIC) 20-12.5 MG  per tablet   Oral   Take 1 tablet by mouth daily.          Triage Vitals: BP 152/111  Pulse 124  Temp(Src) 97.9 F (36.6 C) (Oral)  Resp 24  Ht 5\' 5"  (1.651 m)  Wt 248 lb (112.492 kg)  BMI 41.27 kg/m2  SpO2 98%  LMP 01/21/2014  Physical Exam  Nursing note and vitals reviewed. Constitutional: She appears well-developed and well-nourished. No distress.  HENT:  Head: Normocephalic and atraumatic.  Right Ear: External ear normal.  Left Ear: External ear normal.  Eyes: Conjunctivae are normal. Right eye exhibits no discharge. Left eye exhibits no discharge. No scleral icterus.  Neck: Neck supple. No tracheal deviation present.  Cardiovascular: Normal rate.   Pulmonary/Chest: Effort normal. No stridor. No respiratory distress.  Musculoskeletal: She exhibits tenderness. She exhibits no edema.  3 cm contusion over right medial malleolus with tenderness to palpation but no tenderness to palpation over foot or knee, 2+ dorsalis pedis pulse, toes brisk capillary refill and sensation intact.  Neurological: She is alert. Cranial nerve deficit: no gross deficits.  Skin: Skin is warm and dry. No rash noted.  Psychiatric: She has a normal mood and affect.    ED Course  Procedures (including critical care time)  DIAGNOSTIC STUDIES: Oxygen Saturation is 98% on RA, normal by my interpretation.    COORDINATION OF CARE: 7:00 PM-  Pt advised of plan for treatment and pt agrees.    Labs Review Labs Reviewed - No data to display Imaging Review No results found.   EKG Interpretation None      MDM   Final diagnoses:  Ankle sprain    Pt presenting with c/o pain in right ankle after fall today while playing with children.  Xray reassuring.  Pt placed in ASO.  Given information for ortho followup.  Discharged with strict return precautions.  Pt agreeable with plan.  I personally performed the services described in this documentation, which was scribed in my presence. The recorded  information has been reviewed and is accurate.    Samantha ChickMartha K Linker, MD 01/24/14 [phone]

## 2014-01-21 NOTE — ED Notes (Signed)
Right ankle injury today.  Pt has pain with weight bearing.

## 2014-03-25 ENCOUNTER — Encounter (HOSPITAL_BASED_OUTPATIENT_CLINIC_OR_DEPARTMENT_OTHER): Payer: Self-pay | Admitting: Emergency Medicine

## 2014-03-25 ENCOUNTER — Emergency Department (HOSPITAL_BASED_OUTPATIENT_CLINIC_OR_DEPARTMENT_OTHER)
Admission: EM | Admit: 2014-03-25 | Discharge: 2014-03-25 | Disposition: A | Discharge status: Home / Self Care | Payer: Medicaid Other | Attending: Emergency Medicine | Admitting: Emergency Medicine

## 2014-03-25 DIAGNOSIS — IMO0002 Reserved for concepts with insufficient information to code with codable children: Secondary | ICD-10-CM | POA: Insufficient documentation

## 2014-03-25 DIAGNOSIS — R0602 Shortness of breath: Secondary | ICD-10-CM | POA: Insufficient documentation

## 2014-03-25 DIAGNOSIS — Y9389 Activity, other specified: Secondary | ICD-10-CM | POA: Insufficient documentation

## 2014-03-25 DIAGNOSIS — F17200 Nicotine dependence, unspecified, uncomplicated: Secondary | ICD-10-CM | POA: Insufficient documentation

## 2014-03-25 DIAGNOSIS — S46911A Strain of unspecified muscle, fascia and tendon at shoulder and upper arm level, right arm, initial encounter: Secondary | ICD-10-CM

## 2014-03-25 DIAGNOSIS — Z79899 Other long term (current) drug therapy: Secondary | ICD-10-CM | POA: Insufficient documentation

## 2014-03-25 DIAGNOSIS — X500XXA Overexertion from strenuous movement or load, initial encounter: Secondary | ICD-10-CM | POA: Insufficient documentation

## 2014-03-25 DIAGNOSIS — Y9289 Other specified places as the place of occurrence of the external cause: Secondary | ICD-10-CM | POA: Insufficient documentation

## 2014-03-25 DIAGNOSIS — I10 Essential (primary) hypertension: Secondary | ICD-10-CM | POA: Insufficient documentation

## 2014-03-25 MED ORDER — HYDROCODONE-ACETAMINOPHEN 5-325 MG PO TABS: 2.0000 | ORAL_TABLET | ORAL | Status: DC | PRN

## 2014-03-25 NOTE — ED Provider Notes (Signed)
CSN: [id number]     Arrival date & time 03/25/14  1502 History   First MD Initiated Contact with Patient 03/25/14 1522     Chief Complaint  Patient presents with  . Shoulder Injury     (Consider location/radiation/quality/duration/timing/severity/associated sxs/prior Treatment) Patient is a 33 y.o. female presenting with shoulder injury. The history is provided by the patient. No language interpreter was used.  Shoulder Injury This is a new problem. The current episode started yesterday. The problem occurs constantly. The problem has been unchanged. Exacerbated by: moving. She has tried relaxation (muscle relaxant) for the symptoms.   Samantha Griffin is a 33 y.o. female who presents to the ED with right shoulder pain that started yesterday while she was moving things out of the way to get to her cat that had kittens. The pain is located in the posterior aspect of the shoulder. The pain increases with movement or lifting. She denies any other injuries. She took a flexeril this morning for the pain and it just made her go to sleep. She also has Robaxin at home but thought the Flexeril would be stronger.   Past Medical History  Diagnosis Date  . Hypertension    Past Surgical History  Procedure Laterality Date  . Tubal ligation    . Hernia repair    . Renal artery stent     No family history on file. History  Substance Use Topics  . Smoking status: Current Every Day Smoker    Types: Cigarettes  . Smokeless tobacco: Never Used  . Alcohol Use: No   OB History   Grav Para Term Preterm Abortions TAB SAB Ect Mult Living                 Review of Systems  Musculoskeletal:       Right shoulder pain  All other systems negative    Allergies  Motrin and Ultram  Home Medications   Prior to Admission medications   Medication Sig Start Date End Date Taking? Authorizing Provider  lisinopril-hydrochlorothiazide (PRINZIDE,ZESTORETIC) 20-12.5 MG per tablet Take 1 tablet by mouth daily.     Historical Provider, MD   BP 134/77  Pulse 112  Temp(Src) 98.3 F (36.8 C) (Oral)  Resp 18  Ht 5\' 5"  (1.651 m)  Wt 250 lb (113.399 kg)  BMI 41.60 kg/m2  SpO2 100% Physical Exam  Nursing note and vitals reviewed. Constitutional: She is oriented to person, place, and time. She appears well-developed and well-nourished. No distress.  HENT:  Head: Normocephalic.  Eyes: Conjunctivae and EOM are normal.  Neck: Neck supple.  Cardiovascular: Tachycardia present.   Pulmonary/Chest: Effort normal.  Abdominal: Soft. There is no tenderness.  Musculoskeletal:       Right shoulder: She exhibits tenderness and spasm. She exhibits no swelling, no crepitus, no deformity, no laceration, normal pulse and normal strength. Decreased range of motion: due to pain.       Arms: Radial pulses equal, adequate circulation, good touch sensation. Pain with palpation posterior aspect of the shoulder. Pain with bringing arm over head and behind back.   Neurological: She is alert and oriented to person, place, and time. No cranial nerve deficit.  Skin: Skin is warm and dry.  Psychiatric: She has a normal mood and affect. Her behavior is normal.    ED Course  Procedures  MDM  33 y.o. female with muscle strain to the right shoulder s/p lifting yesterday. Stable for discharge without concern for deltoid disruption. She will take  her Robaxin and apply ice to the area she will follow up with the orthopedic doctor if symptoms persist. Will treat pain.    Medication List    TAKE these medications       HYDROcodone-acetaminophen 5-325 MG per tablet  Commonly known as:  NORCO/VICODIN  Take 2 tablets by mouth every 4 (four) hours as needed.      ASK your doctor about these medications       lisinopril-hydrochlorothiazide 20-12.5 MG per tablet  Commonly known as:  PRINZIDE,ZESTORETIC  Take 1 tablet by mouth daily.           [address]Hope [place] [address] [place], [place] 03/26/14 [phone]

## 2014-03-25 NOTE — ED Notes (Signed)
Patient injured her right shoulder yesterday moving objects

## 2014-03-26 NOTE — ED Provider Notes (Signed)
Medical screening examination/treatment/procedure(s) were performed by non-physician practitioner and as supervising physician I was immediately available for consultation/collaboration.   EKG Interpretation None        Junius ArgyleForrest S Kaesyn Johnston, MD 03/26/14 1505

## 2014-05-12 ENCOUNTER — Emergency Department (HOSPITAL_BASED_OUTPATIENT_CLINIC_OR_DEPARTMENT_OTHER)
Admission: EM | Admit: 2014-05-12 | Discharge: 2014-05-12 | Disposition: A | Discharge status: Home / Self Care | Payer: BC Managed Care – PPO | Attending: Emergency Medicine | Admitting: Emergency Medicine

## 2014-05-12 ENCOUNTER — Encounter (HOSPITAL_BASED_OUTPATIENT_CLINIC_OR_DEPARTMENT_OTHER): Payer: Self-pay | Admitting: Emergency Medicine

## 2014-05-12 DIAGNOSIS — M5432 Sciatica, left side: Secondary | ICD-10-CM

## 2014-05-12 DIAGNOSIS — I10 Essential (primary) hypertension: Secondary | ICD-10-CM | POA: Insufficient documentation

## 2014-05-12 DIAGNOSIS — M545 Low back pain, unspecified: Secondary | ICD-10-CM | POA: Insufficient documentation

## 2014-05-12 DIAGNOSIS — M5430 Sciatica, unspecified side: Secondary | ICD-10-CM | POA: Insufficient documentation

## 2014-05-12 DIAGNOSIS — Z79899 Other long term (current) drug therapy: Secondary | ICD-10-CM | POA: Insufficient documentation

## 2014-05-12 DIAGNOSIS — F17200 Nicotine dependence, unspecified, uncomplicated: Secondary | ICD-10-CM | POA: Insufficient documentation

## 2014-05-12 MED ORDER — OXYCODONE-ACETAMINOPHEN 5-325 MG PO TABS: 2.0000 | ORAL_TABLET | Freq: Every evening | ORAL | Status: DC | PRN

## 2014-05-12 MED ORDER — PREDNISONE 20 MG PO TABS: ORAL_TABLET | ORAL | Status: DC

## 2014-05-12 NOTE — ED Notes (Signed)
Pain that starts in her left lower back and radiates down her left leg. She has had sciatica in the past and feels this may be the same.

## 2014-05-12 NOTE — Discharge Instructions (Signed)
SEEK IMMEDIATE MEDICAL ATTENTION IF: New numbness, tingling, weakness, or problem with the use of your arms or legs.  Severe back pain not relieved with medications.  Change in bowel or bladder control.  Increasing pain in any areas of the body (such as chest or abdominal pain).  Shortness of breath, dizziness or fainting.  Nausea (feeling sick to your stomach), vomiting, fever, or sweats.  

## 2014-05-12 NOTE — ED Provider Notes (Signed)
CSN: [id number]     Arrival date & time 05/12/14  1333 History   First MD Initiated Contact with Patient 05/12/14 1535    This chart was scribed for No att. providers found by Marica OtterNusrat Rahman, ED Scribe. This patient was seen in room MHOTF/OTF and the patient's care was started at 3:35 PM.  Chief Complaint  Patient presents with  . Back Pain   The history is provided by the patient. No language interpreter was used.   HPI Comments: Samantha Griffin is a 33 y.o. female who presents to the Emergency Department complaining of constant moderately severe lower back pain radiating to her left thigh onset a few days ago. Pt complains specifically of positional pain, stating that the pain is aggravated with sitting and relieved when standing/walking. Pt reports taking tylenol at home without much relief. Pt reports a Hx of sciatica; pt states that her current Sx are similar to those experienced concurrently with her sciatica diagnoses. Pt denies fever, trauma, weak, numb, or change B/B fxn.  Past Medical History  Diagnosis Date  . Hypertension    Past Surgical History  Procedure Laterality Date  . Tubal ligation    . Hernia repair    . Renal artery stent     No family history on file. History  Substance Use Topics  . Smoking status: Current Every Day Smoker    Types: Cigarettes  . Smokeless tobacco: Never Used  . Alcohol Use: No   OB History   Grav Para Term Preterm Abortions TAB SAB Ect Mult Living                 Review of Systems  10 Systems reviewed and are negative for acute change except as noted in the HPI.   Allergies  Codeine; Motrin; and Ultram  Home Medications   Prior to Admission medications   Medication Sig Start Date End Date Taking? Authorizing Provider  HYDROcodone-acetaminophen (NORCO/VICODIN) 5-325 MG per tablet Take 2 tablets by mouth every 4 (four) hours as needed. 03/25/14   Hope Orlene OchM Neese, NP  lisinopril-hydrochlorothiazide (PRINZIDE,ZESTORETIC) 20-12.5 MG per  tablet Take 1 tablet by mouth daily.    Historical Provider, MD  oxyCODONE-acetaminophen (PERCOCET) 5-325 MG per tablet Take 2 tablets by mouth at bedtime as needed for severe pain. 05/12/14   Hurman HornJohn M Arianah Torgeson, MD  predniSONE (DELTASONE) 20 MG tablet 3 tabs po day one, then 2 tabs daily x 4 days 05/12/14   Hurman HornJohn M Marte Celani, MD   Triage Vitals: BP 157/106  Pulse 90  Temp(Src) 98.1 F (36.7 C) (Oral)  Resp 20  Ht 5\' 5"  (1.651 m)  Wt 270 lb (122.471 kg)  BMI 44.93 kg/m2  SpO2 99% Physical Exam  Nursing note and vitals reviewed. Constitutional:  Awake, alert, nontoxic appearance with baseline speech.  HENT:  Head: Atraumatic.  Eyes: Pupils are equal, round, and reactive to light. Right eye exhibits no discharge. Left eye exhibits no discharge.  Neck: Neck supple.  Cardiovascular: Normal rate and regular rhythm.   No murmur heard. Pulmonary/Chest: Effort normal and breath sounds normal. No respiratory distress. She has no wheezes. She has no rales. She exhibits no tenderness.  Pulse ox normal  Abdominal: Soft. Bowel sounds are normal. She exhibits no mass. There is no tenderness. There is no rebound.  Musculoskeletal:       Thoracic back: She exhibits no tenderness.       Lumbar back: She exhibits no tenderness.  Bilateral lower extremities non tender without  new rashes or color change, baseline ROM with intact DP, CR<2 secs all digits bilaterally, sensation baseline light touch bilaterally for pt, DTR's symmetric and intact bilaterally KJ / AJ, motor symmetric bilateral 5 / 5 hip flexion, quadriceps, hamstrings, EHL, foot dorsiflexion, foot plantarflexion, gait somewhat antalgic but without apparent new ataxia.  Lumbar back non-tender except left SI region.   Neurological:  Mental status baseline for patient.  Upper extremity motor strength and sensation intact and symmetric bilaterally.  Skin: No rash noted.  Psychiatric: She has a normal mood and affect.    ED Course  Procedures (including  critical care time) DIAGNOSTIC STUDIES: Oxygen Saturation is 99% on RA, nl by my interpretation.    COORDINATION OF CARE: 3:41 PM-Discussed treatment plan which includes meds with pt at bedside and pt agreed to plan.   Labs Review Labs Reviewed - No data to display  Imaging Review No results found.   EKG Interpretation None      MDM   Final diagnoses:  Sciatica, left    I doubt any other EMC precluding discharge at this time including, but not necessarily limited to the following:cauda equina syndrome, SBI.  I personally performed the services described in this documentation, which was scribed in my presence. The recorded information has been reviewed and is accurate.    Hurman Horn, MD 05/13/14 1228

## 2014-06-10 ENCOUNTER — Emergency Department (HOSPITAL_BASED_OUTPATIENT_CLINIC_OR_DEPARTMENT_OTHER)
Admission: EM | Admit: 2014-06-10 | Discharge: 2014-06-10 | Disposition: A | Discharge status: Home / Self Care | Payer: BC Managed Care – PPO | Attending: Emergency Medicine | Admitting: Emergency Medicine

## 2014-06-10 ENCOUNTER — Encounter (HOSPITAL_BASED_OUTPATIENT_CLINIC_OR_DEPARTMENT_OTHER): Payer: Self-pay | Admitting: Emergency Medicine

## 2014-06-10 DIAGNOSIS — K0889 Other specified disorders of teeth and supporting structures: Secondary | ICD-10-CM

## 2014-06-10 DIAGNOSIS — IMO0002 Reserved for concepts with insufficient information to code with codable children: Secondary | ICD-10-CM | POA: Insufficient documentation

## 2014-06-10 DIAGNOSIS — Z79899 Other long term (current) drug therapy: Secondary | ICD-10-CM | POA: Insufficient documentation

## 2014-06-10 DIAGNOSIS — Z792 Long term (current) use of antibiotics: Secondary | ICD-10-CM | POA: Insufficient documentation

## 2014-06-10 DIAGNOSIS — F17200 Nicotine dependence, unspecified, uncomplicated: Secondary | ICD-10-CM | POA: Insufficient documentation

## 2014-06-10 DIAGNOSIS — K089 Disorder of teeth and supporting structures, unspecified: Secondary | ICD-10-CM | POA: Insufficient documentation

## 2014-06-10 DIAGNOSIS — I10 Essential (primary) hypertension: Secondary | ICD-10-CM | POA: Insufficient documentation

## 2014-06-10 MED ORDER — PENICILLIN V POTASSIUM 500 MG PO TABS: 500.0000 mg | ORAL_TABLET | Freq: Four times a day (QID) | ORAL | Status: AC

## 2014-06-10 MED ORDER — HYDROCODONE-ACETAMINOPHEN 5-325 MG PO TABS: 2.0000 | ORAL_TABLET | ORAL | Status: DC | PRN

## 2014-06-10 NOTE — Discharge Instructions (Signed)

## 2014-06-10 NOTE — ED Notes (Signed)
Pt c.o toothache to top right

## 2014-06-10 NOTE — ED Provider Notes (Signed)
CSN: [id number]     Arrival date & time 06/10/14  1504 History   First MD Initiated Contact with Patient 06/10/14 1621     Chief Complaint  Patient presents with  . Dental Pain     (Consider location/radiation/quality/duration/timing/severity/associated sxs/prior Treatment) Patient is a 33 y.o. female presenting with tooth pain. The history is provided by the patient. No language interpreter was used.  Dental Pain Location:  Upper Upper teeth location:  3/RU 1st molar Quality:  Aching Severity:  Moderate Onset quality:  Gradual Timing:  Constant Progression:  Worsening Chronicity:  New Context: not abscess   Relieved by:  Nothing Worsened by:  Nothing tried Ineffective treatments:  None tried Associated symptoms: facial pain, facial swelling and gum swelling     Past Medical History  Diagnosis Date  . Hypertension    Past Surgical History  Procedure Laterality Date  . Tubal ligation    . Hernia repair    . Renal artery stent     No family history on file. History  Substance Use Topics  . Smoking status: Current Every Day Smoker    Types: Cigarettes  . Smokeless tobacco: Never Used  . Alcohol Use: No   OB History   Grav Para Term Preterm Abortions TAB SAB Ect Mult Living                 Review of Systems  HENT: Positive for dental problem and facial swelling.   All other systems reviewed and are negative.     Allergies  Codeine; Motrin; and Ultram  Home Medications   Prior to Admission medications   Medication Sig Start Date End Date Taking? Authorizing Provider  HYDROcodone-acetaminophen (NORCO/VICODIN) 5-325 MG per tablet Take 2 tablets by mouth every 4 (four) hours as needed. 03/25/14   Hope Orlene Och, NP  HYDROcodone-acetaminophen (NORCO/VICODIN) 5-325 MG per tablet Take 2 tablets by mouth every 4 (four) hours as needed. 06/10/14   Elson Areas, PA-C  lisinopril-hydrochlorothiazide (PRINZIDE,ZESTORETIC) 20-12.5 MG per tablet Take 1 tablet by mouth  daily.    Historical Provider, MD  oxyCODONE-acetaminophen (PERCOCET) 5-325 MG per tablet Take 2 tablets by mouth at bedtime as needed for severe pain. 05/12/14   Hurman Horn, MD  penicillin v potassium (VEETID) 500 MG tablet Take 1 tablet (500 mg total) by mouth 4 (four) times daily. 06/10/14 06/17/14  Elson Areas, PA-C  predniSONE (DELTASONE) 20 MG tablet 3 tabs po day one, then 2 tabs daily x 4 days 05/12/14   Hurman Horn, MD   BP 172/98  Pulse 95  Temp(Src) 98.3 F (36.8 C) (Oral)  Resp 20  Ht  (1.651 m)  Wt 250 lb (113.399 kg)  BMI 41.60 kg/m2  SpO2 100%  LMP 06/01/2014 Physical Exam  Nursing note and vitals reviewed. Constitutional: She is oriented to person, place, and time. She appears well-developed and well-nourished.  HENT:  Head: Normocephalic and atraumatic.  Swollen gum upper right   Eyes: EOM are normal. Pupils are equal, round, and reactive to light.  Neck: Normal range of motion.  Cardiovascular: Normal rate.   Pulmonary/Chest: Effort normal.  Abdominal: She exhibits no distension.  Musculoskeletal: Normal range of motion.  Neurological: She is alert and oriented to person, place, and time.  Psychiatric: She has a normal mood and affect.    ED Course  Procedures (including critical care time) Labs Review Labs Reviewed - No data to display  Imaging Review No results found.  EKG Interpretation None      MDM   Final diagnoses:  Tooth ache    Hydrocodone  [place] Pt advised to follow up for dental evaluation    Elson Areas, PA-C 06/10/14 2038

## 2014-06-19 NOTE — ED Provider Notes (Signed)
Medical screening examination/treatment/procedure(s) were performed by non-physician practitioner and as supervising physician I was immediately available for consultation/collaboration.   EKG Interpretation None        Elwin Mocha, MD 06/19/14 1606

## 2014-07-06 ENCOUNTER — Emergency Department (HOSPITAL_BASED_OUTPATIENT_CLINIC_OR_DEPARTMENT_OTHER)
Admission: EM | Admit: 2014-07-06 | Discharge: 2014-07-06 | Disposition: A | Discharge status: Home / Self Care | Payer: BC Managed Care – PPO | Attending: Emergency Medicine | Admitting: Emergency Medicine

## 2014-07-06 ENCOUNTER — Emergency Department (HOSPITAL_BASED_OUTPATIENT_CLINIC_OR_DEPARTMENT_OTHER): Payer: BC Managed Care – PPO

## 2014-07-06 DIAGNOSIS — F17200 Nicotine dependence, unspecified, uncomplicated: Secondary | ICD-10-CM | POA: Insufficient documentation

## 2014-07-06 DIAGNOSIS — S99919A Unspecified injury of unspecified ankle, initial encounter: Secondary | ICD-10-CM

## 2014-07-06 DIAGNOSIS — Y9389 Activity, other specified: Secondary | ICD-10-CM | POA: Insufficient documentation

## 2014-07-06 DIAGNOSIS — S8990XA Unspecified injury of unspecified lower leg, initial encounter: Secondary | ICD-10-CM | POA: Insufficient documentation

## 2014-07-06 DIAGNOSIS — Z79899 Other long term (current) drug therapy: Secondary | ICD-10-CM | POA: Insufficient documentation

## 2014-07-06 DIAGNOSIS — S99929A Unspecified injury of unspecified foot, initial encounter: Secondary | ICD-10-CM

## 2014-07-06 DIAGNOSIS — S8991XA Unspecified injury of right lower leg, initial encounter: Secondary | ICD-10-CM

## 2014-07-06 DIAGNOSIS — I10 Essential (primary) hypertension: Secondary | ICD-10-CM | POA: Insufficient documentation

## 2014-07-06 DIAGNOSIS — W010XXA Fall on same level from slipping, tripping and stumbling without subsequent striking against object, initial encounter: Secondary | ICD-10-CM | POA: Insufficient documentation

## 2014-07-06 DIAGNOSIS — IMO0002 Reserved for concepts with insufficient information to code with codable children: Secondary | ICD-10-CM | POA: Insufficient documentation

## 2014-07-06 DIAGNOSIS — Y929 Unspecified place or not applicable: Secondary | ICD-10-CM | POA: Insufficient documentation

## 2014-07-06 DIAGNOSIS — S8000XA Contusion of unspecified knee, initial encounter: Secondary | ICD-10-CM | POA: Insufficient documentation

## 2014-07-06 MED ORDER — OXYCODONE-ACETAMINOPHEN 5-325 MG PO TABS
2.0000 | ORAL_TABLET | Freq: Once | ORAL | Status: AC
  Administered 2014-07-06: 2 via ORAL
  Filled 2014-07-06: qty 2

## 2014-07-06 NOTE — ED Provider Notes (Signed)
CSN: [id number]     Arrival date & time 07/06/14  1337 History   First MD Initiated Contact with Patient 07/06/14 1348     Chief Complaint  Patient presents with  . Knee Injury     (Consider location/radiation/quality/duration/timing/severity/associated sxs/prior Treatment) Patient is a 33 y.o. female presenting with knee pain.  Knee Pain Location:  Knee Time since incident:  2 days Injury: yes   Mechanism of injury: fall   Fall:    Fall occurred: tripped over a stool.   Point of impact: knee. Knee location:  R knee Pain details:    Quality:  Dull and pressure   Radiates to:  Does not radiate   Onset quality:  Sudden   Duration:  2 days   Timing:  Constant   Progression:  Unchanged Chronicity:  New Relieved by:  Rest Worsened by:  Flexion Ineffective treatments:  Acetaminophen Associated symptoms: swelling   Associated symptoms: no neck pain, no numbness and no tingling     Past Medical History  Diagnosis Date  . Hypertension    Past Surgical History  Procedure Laterality Date  . Tubal ligation    . Hernia repair    . Renal artery stent     No family history on file. History  Substance Use Topics  . Smoking status: Current Every Day Smoker    Types: Cigarettes  . Smokeless tobacco: Never Used  . Alcohol Use: No   OB History   Grav Para Term Preterm Abortions TAB SAB Ect Mult Living                 Review of Systems  Musculoskeletal: Negative for neck pain.  All other systems reviewed and are negative.     Allergies  Codeine; Motrin; and Ultram  Home Medications   Prior to Admission medications   Medication Sig Start Date End Date Taking? Authorizing Provider  HYDROcodone-acetaminophen (NORCO/VICODIN) 5-325 MG per tablet Take 2 tablets by mouth every 4 (four) hours as needed. 03/25/14   Hope Orlene Och, NP  HYDROcodone-acetaminophen (NORCO/VICODIN) 5-325 MG per tablet Take 2 tablets by mouth every 4 (four) hours as needed. 06/10/14   Elson Areas,  PA-C  lisinopril-hydrochlorothiazide (PRINZIDE,ZESTORETIC) 20-12.5 MG per tablet Take 1 tablet by mouth daily.    Historical Provider, MD  oxyCODONE-acetaminophen (PERCOCET) 5-325 MG per tablet Take 2 tablets by mouth at bedtime as needed for severe pain. 05/12/14   Hurman Horn, MD  predniSONE (DELTASONE) 20 MG tablet 3 tabs po day one, then 2 tabs daily x 4 days 05/12/14   Hurman Horn, MD   BP 160/106  Pulse 104  Temp(Src) 98.3 F (36.8 C) (Oral)  Resp 20  Ht  (1.676 m)  Wt 245 lb (111.131 kg)  BMI 39.56 kg/m2  SpO2 100%  LMP 06/01/2014 Physical Exam  Nursing note and vitals reviewed. Constitutional: She is oriented to person, place, and time. She appears well-developed and well-nourished. No distress.  HENT:  Head: Normocephalic and atraumatic.  Eyes: Conjunctivae are normal. No scleral icterus.  Neck: Neck supple.  Cardiovascular: Normal rate and intact distal pulses.   Pulmonary/Chest: Effort normal. No stridor. No respiratory distress.  Abdominal: Normal appearance. She exhibits no distension.  Musculoskeletal:       Right knee: She exhibits swelling, effusion and ecchymosis. She exhibits normal range of motion (pain with flexion), no deformity, no LCL laxity [id number] MCL laxity. Tenderness found.  Neurological: She is alert and oriented to person,  place, and time.  Skin: Skin is warm and dry. No rash noted.  Psychiatric: She has a normal mood and affect. Her behavior is normal.    ED Course  Procedures (including critical care time) Labs Review Labs Reviewed - No data to display  Imaging Review Dg Knee Complete 4 Views Right  07/06/2014   CLINICAL DATA:  Larey Seat yesterday.  Right knee pain.  EXAM: RIGHT KNEE - COMPLETE 4+ VIEW  COMPARISON:  None.  FINDINGS: No fracture. Knee joint is normally space and aligned. No convincing joint effusion. There is anterior soft tissue swelling.  IMPRESSION: No fracture or joint abnormality.   Electronically Signed   By: Amie Portland  M.D.   On: 07/06/2014 14:08  All radiology studies independently viewed by me.       EKG Interpretation None      MDM   Final diagnoses:  Right knee injury, initial encounter    33 yo female with right knee injury two days ago.  Plain films negative.  Knee joint stable.  Plan knee immobilizer and follow up.      Candyce Churn III, MD 07/06/14 [phone]

## 2014-07-06 NOTE — ED Notes (Signed)
In to triage pt. Dr. Loretha Stapler at bedside.

## 2014-07-06 NOTE — ED Notes (Signed)
Pt. Reports falling on to the R knee on Wed. Night.

## 2014-07-17 ENCOUNTER — Encounter (HOSPITAL_BASED_OUTPATIENT_CLINIC_OR_DEPARTMENT_OTHER): Payer: Self-pay | Admitting: Emergency Medicine

## 2014-07-17 ENCOUNTER — Emergency Department (HOSPITAL_BASED_OUTPATIENT_CLINIC_OR_DEPARTMENT_OTHER): Payer: BC Managed Care – PPO

## 2014-07-17 ENCOUNTER — Emergency Department (HOSPITAL_BASED_OUTPATIENT_CLINIC_OR_DEPARTMENT_OTHER)
Admission: EM | Admit: 2014-07-17 | Discharge: 2014-07-18 | Disposition: A | Discharge status: Home / Self Care | Payer: BC Managed Care – PPO | Attending: Emergency Medicine | Admitting: Emergency Medicine

## 2014-07-17 DIAGNOSIS — Y9389 Activity, other specified: Secondary | ICD-10-CM | POA: Insufficient documentation

## 2014-07-17 DIAGNOSIS — Y929 Unspecified place or not applicable: Secondary | ICD-10-CM | POA: Insufficient documentation

## 2014-07-17 DIAGNOSIS — S5002XA Contusion of left elbow, initial encounter: Secondary | ICD-10-CM

## 2014-07-17 DIAGNOSIS — W01198A Fall on same level from slipping, tripping and stumbling with subsequent striking against other object, initial encounter: Secondary | ICD-10-CM | POA: Insufficient documentation

## 2014-07-17 DIAGNOSIS — I10 Essential (primary) hypertension: Secondary | ICD-10-CM | POA: Insufficient documentation

## 2014-07-17 DIAGNOSIS — Z720 Tobacco use: Secondary | ICD-10-CM | POA: Insufficient documentation

## 2014-07-17 DIAGNOSIS — Z7952 Long term (current) use of systemic steroids: Secondary | ICD-10-CM | POA: Insufficient documentation

## 2014-07-17 NOTE — ED Notes (Signed)
Pt c/o left elbow injury x 2 hrs ago 

## 2014-07-18 MED ORDER — ACETAMINOPHEN 500 MG PO TABS
1000.0000 mg | ORAL_TABLET | Freq: Once | ORAL | Status: AC
  Administered 2014-07-18: 1000 mg via ORAL
  Filled 2014-07-18: qty 2

## 2014-07-18 NOTE — ED Notes (Signed)
PT discharged to home with family. NAD. 

## 2014-07-18 NOTE — Discharge Instructions (Signed)
Contusion °A contusion is a deep bruise. Contusions are the result of an injury that caused bleeding under the skin. The contusion may turn blue, purple, or yellow. Minor injuries will give you a painless contusion, but more severe contusions may stay painful and swollen for a few weeks.  °CAUSES  °A contusion is usually caused by a blow, trauma, or direct force to an area of the body. °SYMPTOMS  °· Swelling and redness of the injured area. °· Bruising of the injured area. °· Tenderness and soreness of the injured area. °· Pain. °DIAGNOSIS  °The diagnosis can be made by taking a history and physical exam. An X-ray, CT scan, or MRI may be needed to determine if there were any associated injuries, such as fractures. °TREATMENT  °Specific treatment will depend on what area of the body was injured. In general, the best treatment for a contusion is resting, icing, elevating, and applying cold compresses to the injured area. Over-the-counter medicines may also be recommended for pain control. Ask your caregiver what the best treatment is for your contusion. °HOME CARE INSTRUCTIONS  °· Put ice on the injured area. °¨ Put ice in a plastic bag. °¨ Place a towel between your skin and the bag. °¨ Leave the ice on for 15-20 minutes, 3-4 times a day, or as directed by your health care provider. °· Only take over-the-counter or prescription medicines for pain, discomfort, or fever as directed by your caregiver. Your caregiver may recommend avoiding anti-inflammatory medicines (aspirin, ibuprofen, and naproxen) for 48 hours because these medicines may increase bruising. °· Rest the injured area. °· If possible, elevate the injured area to reduce swelling. °SEEK IMMEDIATE MEDICAL CARE IF:  °· You have increased bruising or swelling. °· You have pain that is getting worse. °· Your swelling or pain is not relieved with medicines. °MAKE SURE YOU:  °· Understand these instructions. °· Will watch your condition. °· Will get help right  away if you are not doing well or get worse. °Document Released: 07/08/2005 Document Revised: 10/03/2013 Document Reviewed: 08/03/2011 °[employer]® Patient Information ©2015 [employer]. This information is not intended to replace advice given to you by your health care provider. Make sure you discuss any questions you have with your health care provider. ° °

## 2014-07-18 NOTE — ED Provider Notes (Signed)
CSN: [id number]     Arrival date & time 07/17/14  2159 History   First MD Initiated Contact with Patient 07/18/14 0023     Chief Complaint  Patient presents with  . Elbow Injury     (Consider location/radiation/quality/duration/timing/severity/associated sxs/prior Treatment) Patient is a 33 y.o. female presenting with arm injury.  Arm Injury Location:  Elbow Time since incident: a few hours. Injury: yes   Mechanism of injury: fall   Fall:    Fall occurred: slipped, hit left elbow on the bumper of her car.   Height of fall:  Standing   Point of impact: left elbow. Elbow location:  L elbow Pain details:    Quality:  Pressure and dull   Severity:  Moderate   Onset quality:  Sudden   Timing:  Constant   Progression:  Unchanged Chronicity:  New Relieved by:  Immobilization Worsened by:  Movement Associated symptoms: swelling   Associated symptoms: no back pain, no decreased range of motion (pain with ROM) and no muscle weakness     Past Medical History  Diagnosis Date  . Hypertension    Past Surgical History  Procedure Laterality Date  . Tubal ligation    . Hernia repair    . Renal artery stent     History reviewed. No pertinent family history. History  Substance Use Topics  . Smoking status: Current Every Day Smoker    Types: Cigarettes  . Smokeless tobacco: Never Used  . Alcohol Use: No   OB History   Grav Para Term Preterm Abortions TAB SAB Ect Mult Living                 Review of Systems  Musculoskeletal: Negative for back pain.  All other systems reviewed and are negative.     Allergies  Codeine; Motrin; and Ultram  Home Medications   Prior to Admission medications   Medication Sig Start Date End Date Taking? Authorizing Provider  HYDROcodone-acetaminophen (NORCO/VICODIN) 5-325 MG per tablet Take 2 tablets by mouth every 4 (four) hours as needed. 03/25/14   Hope Orlene Och, NP  HYDROcodone-acetaminophen (NORCO/VICODIN) 5-325 MG per tablet Take 2  tablets by mouth every 4 (four) hours as needed. 06/10/14   Elson Areas, PA-C  lisinopril-hydrochlorothiazide (PRINZIDE,ZESTORETIC) 20-12.5 MG per tablet Take 1 tablet by mouth daily.    Historical Provider, MD  oxyCODONE-acetaminophen (PERCOCET) 5-325 MG per tablet Take 2 tablets by mouth at bedtime as needed for severe pain. 05/12/14   Hurman Horn, MD  predniSONE (DELTASONE) 20 MG tablet 3 tabs po day one, then 2 tabs daily x 4 days 05/12/14   Hurman Horn, MD   BP 187/90  Pulse 95  Temp(Src) 98.8 F (37.1 C) (Oral)  Resp 16  Wt 245 lb (111.131 kg)  SpO2 100%  LMP 07/02/2014 Physical Exam  Nursing note and vitals reviewed. Constitutional: She is oriented to person, place, and time. She appears well-developed and well-nourished. No distress.  HENT:  Head: Normocephalic and atraumatic.  Eyes: Conjunctivae are normal. No scleral icterus.  Neck: Neck supple. No spinous process tenderness and no muscular tenderness present.  Cardiovascular: Normal rate and intact distal pulses.   Pulmonary/Chest: Effort normal. No stridor. No respiratory distress.  Abdominal: Normal appearance. She exhibits no distension.  Musculoskeletal:       Left elbow: She exhibits swelling (Ecchymosis). She exhibits normal range of motion and no deformity. Tenderness found.       Thoracic back: She exhibits no tenderness  and no bony tenderness.       Lumbar back: She exhibits no tenderness and no bony tenderness.  Neurological: She is alert and oriented to person, place, and time.  Skin: Skin is warm and dry. No rash noted.  Psychiatric: She has a normal mood and affect. Her behavior is normal.    ED Course  Procedures (including critical care time) Labs Review Labs Reviewed - No data to display  Imaging Review Dg Elbow Complete Left  07/17/2014   CLINICAL DATA:  Initial encounter for fall. Struck elbow on car bumper.  EXAM: LEFT ELBOW - COMPLETE 3+ VIEW  COMPARISON:  None.  FINDINGS: The left elbow is  located. No acute bone or soft tissue abnormalities are present.  IMPRESSION: Negative left elbow radiographs   Electronically Signed   By: Gennette Pachris  Mattern M.D.   On: 07/17/2014 23:19  All radiology studies independently viewed by me.      EKG Interpretation None      MDM   Final diagnoses:  Left elbow contusion, initial encounter    33 year old female who complains of left elbow pain after striking it on the bumper of her car. She has a contusion, but no fracture seen on plain film. Neurovascularly intact. We'll place an Ace wrap.  Have advised supportive care. No other injuries identified by history or exam.    Warnell Foresterrey Daqwan Dougal, MD 07/18/14 0045

## 2014-08-17 ENCOUNTER — Emergency Department (HOSPITAL_BASED_OUTPATIENT_CLINIC_OR_DEPARTMENT_OTHER)
Admission: EM | Admit: 2014-08-17 | Discharge: 2014-08-17 | Disposition: A | Discharge status: Home / Self Care | Payer: BC Managed Care – PPO | Attending: Emergency Medicine | Admitting: Emergency Medicine

## 2014-08-17 ENCOUNTER — Encounter (HOSPITAL_BASED_OUTPATIENT_CLINIC_OR_DEPARTMENT_OTHER): Payer: Self-pay

## 2014-08-17 DIAGNOSIS — Y828 Other medical devices associated with adverse incidents: Secondary | ICD-10-CM | POA: Insufficient documentation

## 2014-08-17 DIAGNOSIS — K08109 Complete loss of teeth, unspecified cause, unspecified class: Secondary | ICD-10-CM | POA: Insufficient documentation

## 2014-08-17 DIAGNOSIS — Z720 Tobacco use: Secondary | ICD-10-CM | POA: Insufficient documentation

## 2014-08-17 DIAGNOSIS — K0381 Cracked tooth: Secondary | ICD-10-CM | POA: Insufficient documentation

## 2014-08-17 DIAGNOSIS — T85848A Pain due to other internal prosthetic devices, implants and grafts, initial encounter: Secondary | ICD-10-CM

## 2014-08-17 DIAGNOSIS — Z79899 Other long term (current) drug therapy: Secondary | ICD-10-CM | POA: Insufficient documentation

## 2014-08-17 DIAGNOSIS — I10 Essential (primary) hypertension: Secondary | ICD-10-CM | POA: Insufficient documentation

## 2014-08-17 DIAGNOSIS — T8584XA Pain due to internal prosthetic devices, implants and grafts, not elsewhere classified, initial encounter: Secondary | ICD-10-CM | POA: Insufficient documentation

## 2014-08-17 MED ORDER — AMOXICILLIN 500 MG PO CAPS: 500.0000 mg | ORAL_CAPSULE | Freq: Two times a day (BID) | ORAL | Status: AC

## 2014-08-17 MED ORDER — AMOXICILLIN 500 MG PO CAPS
500.0000 mg | ORAL_CAPSULE | Freq: Once | ORAL | Status: AC
Start: 2014-08-17 — End: 2014-08-17
  Administered 2014-08-17: 500 mg via ORAL
  Filled 2014-08-17: qty 1

## 2014-08-17 MED ORDER — HYDROCODONE-ACETAMINOPHEN 5-325 MG PO TABS: 2.0000 | ORAL_TABLET | Freq: Three times a day (TID) | ORAL | Status: AC | PRN

## 2014-08-17 NOTE — ED Notes (Signed)
Right upper dental pain x 2-3 days. 

## 2014-08-17 NOTE — ED Provider Notes (Signed)
CSN: [id number]     Arrival date & time 08/17/14  1550 History   First MD Initiated Contact with Patient 08/17/14 1632     Chief Complaint  Patient presents with  . Dental Pain     (Consider location/radiation/quality/duration/timing/severity/associated sxs/prior Treatment) HPI Patient presents with pain and swelling about the right lateral face.  Symptoms began approximately 3 days ago.  Since onset pain is become more severe.  Patient has tried home antibiotics, Tylenol without relief. Note dyspnea, dysphagia, fever, chills. Patient has known dental issues in the area. No other complaints. Past Medical History  Diagnosis Date  . Hypertension    Past Surgical History  Procedure Laterality Date  . Tubal ligation    . Hernia repair    . Renal artery stent     No family history on file. History  Substance Use Topics  . Smoking status: Current Every Day Smoker    Types: Cigarettes  . Smokeless tobacco: Never Used  . Alcohol Use: No   OB History    No data available     Review of Systems  Constitutional: Negative for fever.  HENT: Positive for dental problem.   Eyes: Negative for pain and visual disturbance.  Respiratory: Negative for chest tightness and shortness of breath.   Cardiovascular: Negative for chest pain.  Gastrointestinal: Negative for nausea.      Allergies  Codeine; Motrin; and Ultram  Home Medications   Prior to Admission medications   Medication Sig Start Date End Date Taking? Authorizing Provider  amoxicillin (AMOXIL) 500 MG capsule Take 1 capsule (500 mg total) by mouth 2 (two) times daily. 08/17/14   Gerhard Munchobert Denean Pavon, MD  HYDROcodone-acetaminophen (NORCO/VICODIN) 5-325 MG per tablet Take 2 tablets by mouth 3 (three) times daily as needed for severe pain. 08/17/14   Gerhard Munchobert Dalante Minus, MD  lisinopril-hydrochlorothiazide (PRINZIDE,ZESTORETIC) 20-12.5 MG per tablet Take 1 tablet by mouth daily.    Historical Provider, MD   BP 180/115 mmHg  Pulse 106   Temp(Src) 99.2 F (37.3 C) (Oral)  Resp 18  Ht 5\' 6"  (1.676 m)  Wt 250 lb (113.399 kg)  BMI 40.37 kg/m2  SpO2 100%  LMP 08/01/2014 Physical Exam  Constitutional: She appears well-developed and well-nourished. No distress.  HENT:  Head: Normocephalic.  Mouth/Throat:    Skin: She is not diaphoretic.  Nursing note and vitals reviewed.   ED Course  Procedures (including critical care time) I reviewed the patient's MEDICAL record.   MDM   Final diagnoses:  Dental implant pain, initial encounter    Patient presents with dental pain, no evidence for bacteremia, sepsis, airway compromise.  Patient was provided additional resources for definitive care.  She was discharged in stable condition.    Gerhard Munchobert Io Dieujuste, MD 08/17/14 [phone]

## 2014-08-17 NOTE — Discharge Instructions (Signed)
Please be sure to follow-up with either your or our dentist for definitive care of your tooth.  Return here for concerning changes in your condition.

## 2015-04-22 IMAGING — CR DG ELBOW COMPLETE 3+V*L*
4 series · 4 of 4 positions shown · non-contrast
Comparison: None.

CLINICAL DATA: Initial encounter for fall. Struck elbow on car
bumper.

EXAM:
LEFT ELBOW - COMPLETE 3+ VIEW

[x elbow joint ap left]
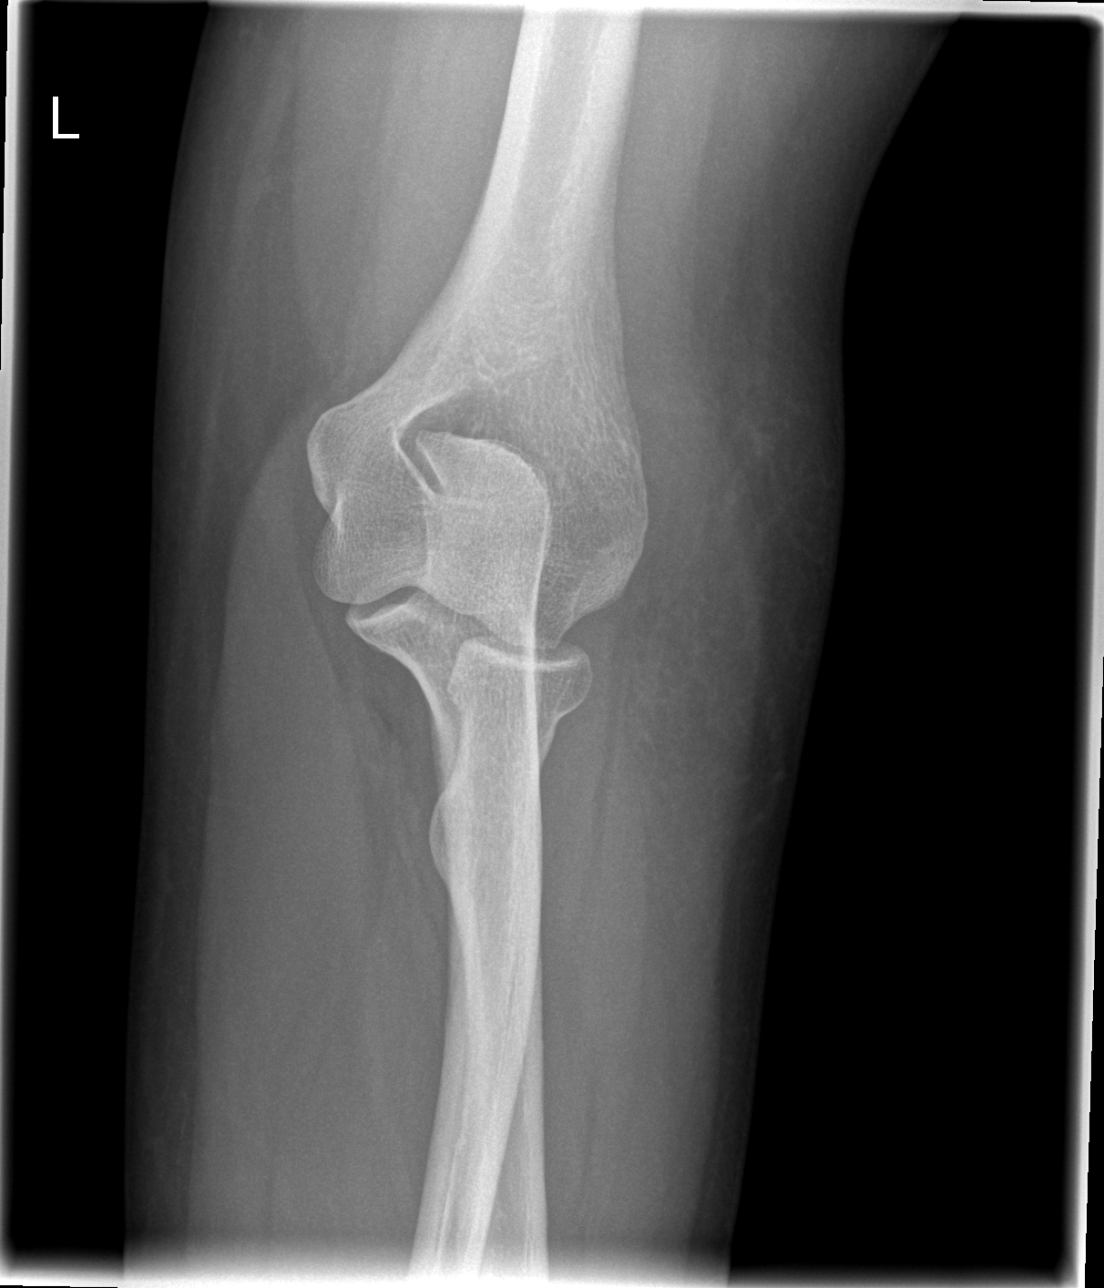

[x elbow joint obl. left (1 of 2)]
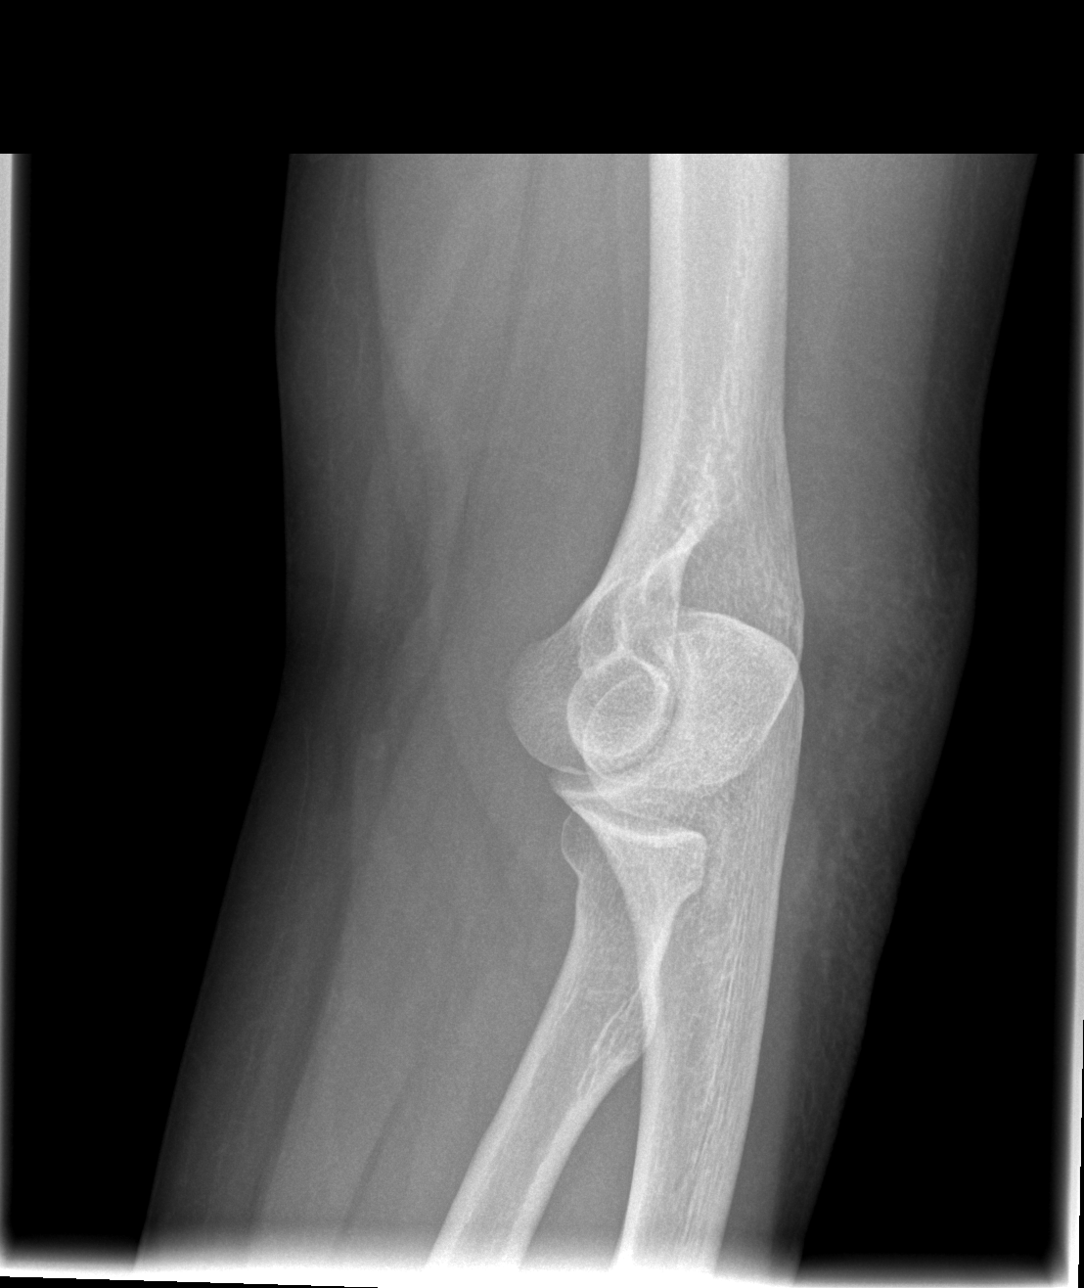

[x elbow joint obl. left (2 of 2)]
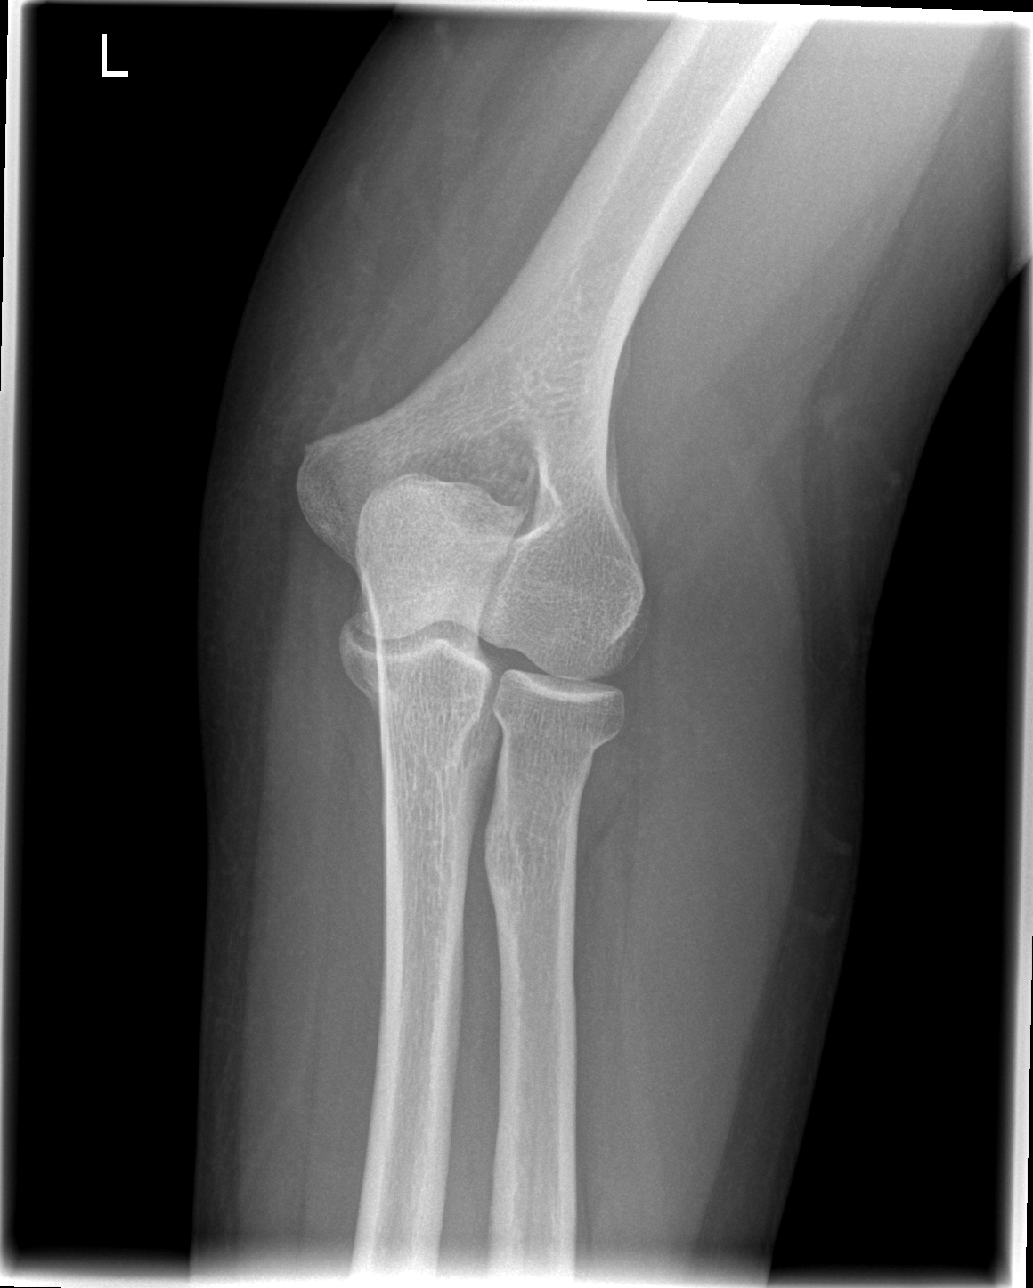

[x elbow joint lat left]
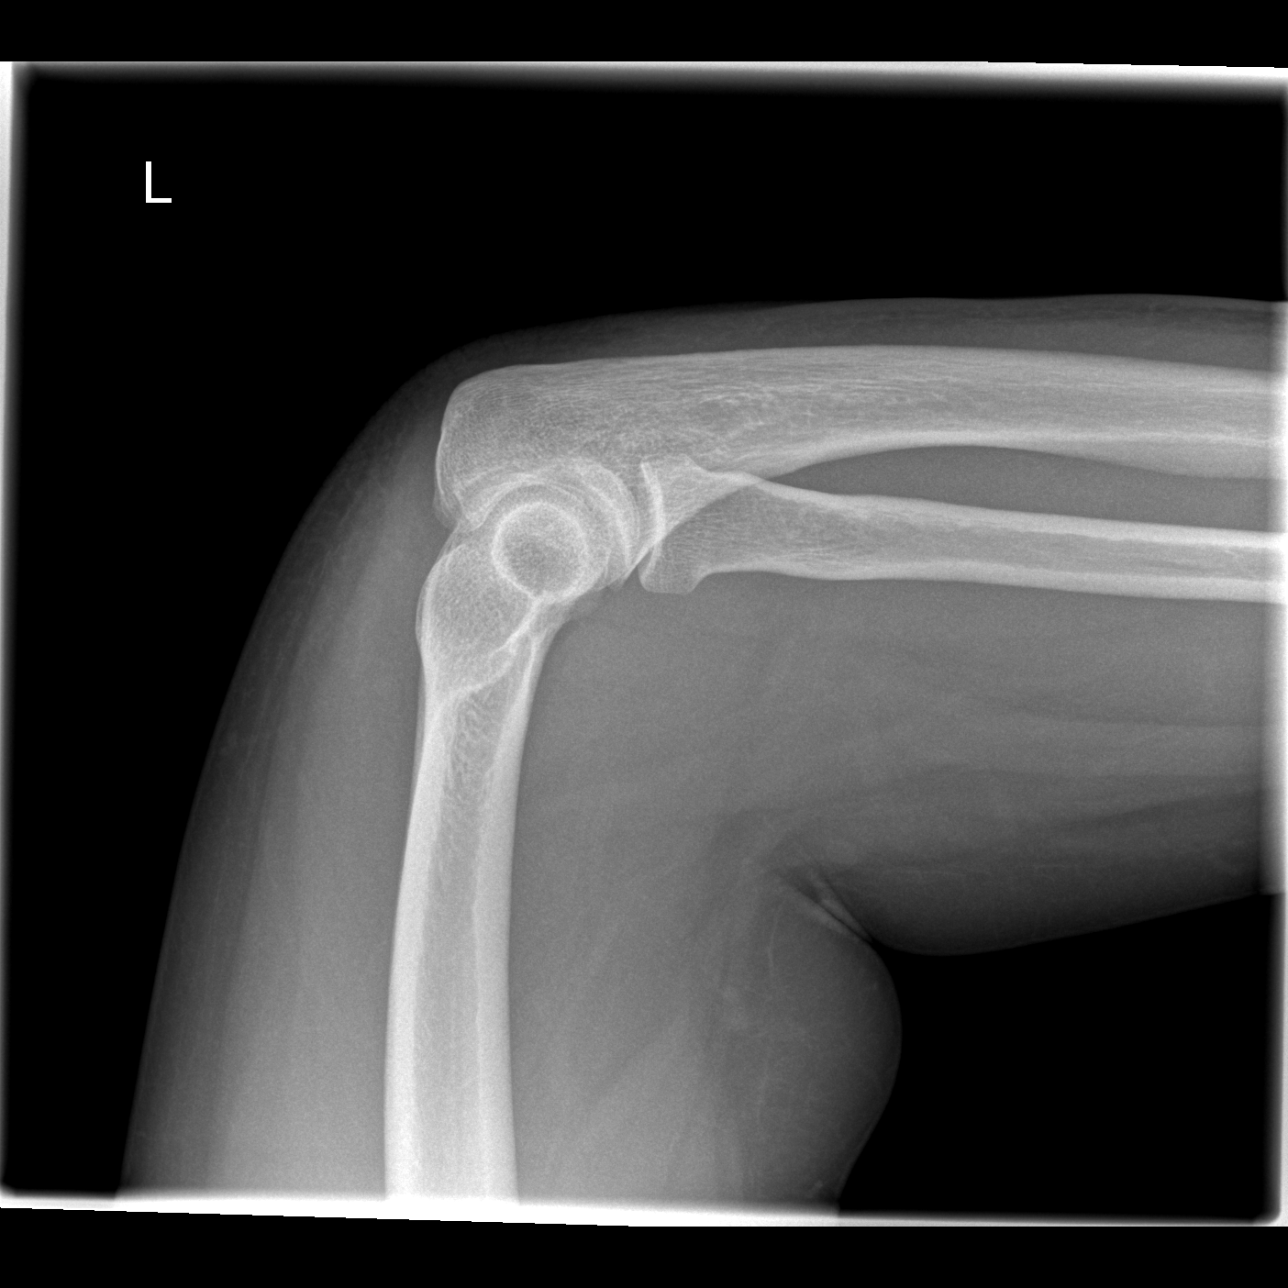

[4 of 4 positions shown; findings below may reference images not displayed]

FINDINGS: The left elbow is located. No acute bone or soft tissue
abnormalities are present.
IMPRESSION: Negative left elbow radiographs

## 2018-05-14 ENCOUNTER — Encounter (HOSPITAL_BASED_OUTPATIENT_CLINIC_OR_DEPARTMENT_OTHER): Payer: Self-pay | Admitting: Emergency Medicine

## 2018-05-14 ENCOUNTER — Other Ambulatory Visit: Payer: Self-pay

## 2018-05-14 ENCOUNTER — Emergency Department (HOSPITAL_BASED_OUTPATIENT_CLINIC_OR_DEPARTMENT_OTHER)
Admission: EM | Admit: 2018-05-14 | Discharge: 2018-05-14 | Disposition: A | Discharge status: Home / Self Care | Payer: Medicaid Other | Attending: Emergency Medicine | Admitting: Emergency Medicine

## 2018-05-14 DIAGNOSIS — Z79899 Other long term (current) drug therapy: Secondary | ICD-10-CM | POA: Insufficient documentation

## 2018-05-14 DIAGNOSIS — I10 Essential (primary) hypertension: Secondary | ICD-10-CM | POA: Diagnosis not present

## 2018-05-14 DIAGNOSIS — F17210 Nicotine dependence, cigarettes, uncomplicated: Secondary | ICD-10-CM | POA: Insufficient documentation

## 2018-05-14 DIAGNOSIS — K0889 Other specified disorders of teeth and supporting structures: Secondary | ICD-10-CM | POA: Diagnosis present

## 2018-05-14 MED ORDER — NAPROXEN 500 MG PO TABS: 500.0000 mg | ORAL_TABLET | Freq: Two times a day (BID) | ORAL | 0 refills | Status: AC

## 2018-05-14 MED ORDER — PENICILLIN V POTASSIUM 500 MG PO TABS: 500.0000 mg | ORAL_TABLET | Freq: Four times a day (QID) | ORAL | 0 refills | Status: AC

## 2018-05-14 NOTE — ED Provider Notes (Signed)
[facility] EMERGENCY DEPARTMENT Provider Note   CSN: [id number] Arrival date & time: 05/14/18  1358     History   Chief Complaint Chief Complaint  Patient presents with  . Dental Pain    HPI Samantha Griffin is a 37 y.o. female with history of hypertension who presents with 3-day history of right-sided dental pain.  Patient reports she has had problems with the the lower molars before that it is fractured, but fractured tooth 4 days ago when she opened the hood of her car and hit her in the mouth.  She chipped her tooth.  She has had pain to that she is in a lower tooth.  She denies any fevers or neck pain.  She has taken Tylenol at home without relief.  She reports she initially did not have very much pain to the chipped tooth, but has had progressive pain since.  Patient does have a dentist and plans to call them on Monday.  HPI  Past Medical History:  Diagnosis Date  . Hypertension     There are no active problems to display for this patient.   Past Surgical History:  Procedure Laterality Date  . HERNIA REPAIR    . RENAL ARTERY STENT    . TUBAL LIGATION       OB History   None      Home Medications    Prior to Admission medications   Medication Sig Start Date End Date Taking? Authorizing Provider  amoxicillin (AMOXIL) 500 MG capsule Take 1 capsule (500 mg total) by mouth 2 (two) times daily. 08/17/14   Gerhard Munch, MD  HYDROcodone-acetaminophen (NORCO/VICODIN) 5-325 MG per tablet Take 2 tablets by mouth 3 (three) times daily as needed for severe pain. 08/17/14   Gerhard Munch, MD  lisinopril-hydrochlorothiazide (PRINZIDE,ZESTORETIC) 20-12.5 MG per tablet Take 1 tablet by mouth daily.    [provider]  naproxen (NAPROSYN) 500 MG tablet Take 1 tablet (500 mg total) by mouth 2 (two) times daily. 05/14/18   Laporsche Hoeger, Waylan Boga, PA-C  penicillin v potassium (VEETID) 500 MG tablet Take 1 tablet (500 mg total) by mouth 4 (four) times daily for 7 days.  05/14/18 05/21/18  Emi Holes, PA-C    Family History No family history on file.  Social History Social History   Tobacco Use  . Smoking status: Current Every Day Smoker    Types: Cigarettes  . Smokeless tobacco: Never Used  Substance Use Topics  . Alcohol use: No  . Drug use: No     Allergies   Codeine; Motrin [ibuprofen]; and Ultram [tramadol]   Review of Systems Review of Systems  Constitutional: Negative for fever.  HENT: Positive for dental problem.   Musculoskeletal: Negative for neck pain.     Physical Exam Updated Vital Signs BP (!) 161/82 (BP Location: Left Arm)   Pulse 64   Temp 98.5 F (36.9 C) (Oral)   Resp 18   Ht 5\' 5"  (1.651 m)   Wt 89.4 kg (197 lb)   LMP 04/12/2018   SpO2 100%   BMI 32.78 kg/m   Physical Exam  Constitutional: She appears well-developed and well-nourished. No distress.  HENT:  Head: Normocephalic and atraumatic.  Mouth/Throat: Oropharynx is clear and moist and mucous membranes are normal. No trismus in the jaw. Abnormal dentition (very poor dentition). Dental caries present. No dental abscesses. No oropharyngeal exudate, posterior oropharyngeal edema, posterior oropharyngeal erythema or tonsillar abscesses.    No submandibular tenderness or masses  Eyes: Pupils are equal, round, and reactive to light. Conjunctivae are normal. Right eye exhibits no discharge. Left eye exhibits no discharge. No scleral icterus.  Neck: Normal range of motion. Neck supple. No thyromegaly present.  Cardiovascular: Normal rate, regular rhythm, normal heart sounds and intact distal pulses. Exam reveals no gallop and no friction rub.  No murmur heard. Pulmonary/Chest: Effort normal and breath sounds normal. No stridor. No respiratory distress. She has no wheezes. She has no rales.  Musculoskeletal: She exhibits no edema.  Lymphadenopathy:    She has no cervical adenopathy.  Neurological: She is alert. Coordination normal.  Skin: Skin is warm and  dry. No rash noted. She is not diaphoretic. No pallor.  Psychiatric: She has a normal mood and affect.  Nursing note and vitals reviewed.    ED Treatments / Results  Labs (all labs ordered are listed, but only abnormal results are displayed) Labs Reviewed - No data to display  EKG None  Radiology No results found.  Procedures Procedures (including critical care time)  Medications Ordered in ED Medications - No data to display   Initial Impression / Assessment and Plan / ED Course  I have reviewed the triage vital signs and the nursing notes.  Pertinent labs & imaging results that were available during my care of the patient were reviewed by me and considered in my medical decision making (see chart for details).     Patient with dentalgia.  One area after a fracture.  No bony tenderness to the jaw or root of the tooth. No abscess requiring immediate incision and drainage.  Exam not concerning for Ludwig's angina or pharyngeal abscess.  Will treat with penicillin and Naprosyn. Patient reports taking Aleve.  In the past without reaction, as her allergy list shows ibuprofen causing hives.  Pt instructed to follow-up with dentist.  Discussed return precautions.  Patient understands and agrees with plan.   Final Clinical Impressions(s) / ED Diagnoses   Final diagnoses:  Pain, dental    ED Discharge Orders        Ordered    penicillin v potassium (VEETID) 500 MG tablet  4 times daily     05/14/18 1609    naproxen (NAPROSYN) 500 MG tablet  2 times daily     05/14/18 [address]1609       Kamilah Correia M, PA-C 05/14/18 1620    Charlynne PanderYao, David Hsienta, MD 05/15/18 1504

## 2018-05-14 NOTE — Discharge Instructions (Signed)
Medications: Penicillin, Naprosyn  Treatment: Take penicillin as prescribed until completed.  Take Naprosyn twice daily as needed for alternate with Tylenol.  Follow-up: Please follow-up with a dentist for further evaluation and treatment of your dental pain.  Please return the emergency department if you develop any new or worsening symptoms including fever, significant masses in your mouth or neck, or any other new concerning symptom.

## 2018-05-14 NOTE — ED Triage Notes (Signed)
R side dental pain x 2 days.
